# Patient Record
Sex: Female | Born: 1950 | Race: Black or African American | Hispanic: No | Smoking: Never smoker
Health system: Southern US, Community
[De-identification: ages and names within clinical notes are randomized; demographics above are authoritative.]

## PROBLEM LIST (undated history)

## (undated) DIAGNOSIS — E039 Hypothyroidism, unspecified: Secondary | ICD-10-CM

## (undated) DIAGNOSIS — J45909 Unspecified asthma, uncomplicated: Secondary | ICD-10-CM

## (undated) DIAGNOSIS — J309 Allergic rhinitis, unspecified: Secondary | ICD-10-CM

## (undated) DIAGNOSIS — I10 Essential (primary) hypertension: Secondary | ICD-10-CM

## (undated) DIAGNOSIS — E119 Type 2 diabetes mellitus without complications: Secondary | ICD-10-CM

## (undated) HISTORY — DX: Type 2 diabetes mellitus without complications: E11.9

## (undated) HISTORY — PX: KNEE SURGERY: SHX244

## (undated) HISTORY — DX: Hypothyroidism, unspecified: E03.9

## (undated) HISTORY — DX: Essential (primary) hypertension: I10

## (undated) HISTORY — DX: Unspecified asthma, uncomplicated: J45.909

## (undated) HISTORY — DX: Allergic rhinitis, unspecified: J30.9

---

## 2007-12-08 ENCOUNTER — Encounter: Payer: Self-pay | Admitting: Pulmonary Disease

## 2008-07-13 ENCOUNTER — Ambulatory Visit: Payer: Self-pay | Admitting: Pulmonary Disease

## 2008-07-13 DIAGNOSIS — E119 Type 2 diabetes mellitus without complications: Secondary | ICD-10-CM | POA: Insufficient documentation

## 2008-07-13 DIAGNOSIS — J309 Allergic rhinitis, unspecified: Secondary | ICD-10-CM | POA: Insufficient documentation

## 2008-07-13 DIAGNOSIS — I10 Essential (primary) hypertension: Secondary | ICD-10-CM | POA: Insufficient documentation

## 2008-07-13 DIAGNOSIS — G4733 Obstructive sleep apnea (adult) (pediatric): Secondary | ICD-10-CM | POA: Insufficient documentation

## 2008-07-13 DIAGNOSIS — J45909 Unspecified asthma, uncomplicated: Secondary | ICD-10-CM | POA: Insufficient documentation

## 2008-07-17 ENCOUNTER — Telehealth: Payer: Self-pay | Admitting: Pulmonary Disease

## 2008-08-17 ENCOUNTER — Telehealth (INDEPENDENT_AMBULATORY_CARE_PROVIDER_SITE_OTHER): Payer: Self-pay | Admitting: *Deleted

## 2008-12-30 ENCOUNTER — Encounter: Payer: Self-pay | Admitting: Pulmonary Disease

## 2008-12-30 ENCOUNTER — Ambulatory Visit (HOSPITAL_BASED_OUTPATIENT_CLINIC_OR_DEPARTMENT_OTHER): Admission: RE | Admit: 2008-12-30 | Discharge: 2008-12-30 | Payer: Self-pay | Admitting: Pulmonary Disease

## 2009-01-10 ENCOUNTER — Ambulatory Visit: Payer: Self-pay | Admitting: Pulmonary Disease

## 2009-01-10 ENCOUNTER — Telehealth (INDEPENDENT_AMBULATORY_CARE_PROVIDER_SITE_OTHER): Payer: Self-pay | Admitting: *Deleted

## 2010-09-03 ENCOUNTER — Encounter: Payer: Self-pay | Admitting: Pulmonary Disease

## 2010-09-05 ENCOUNTER — Ambulatory Visit: Payer: Self-pay | Admitting: Pulmonary Disease

## 2010-09-10 ENCOUNTER — Encounter: Payer: Self-pay | Admitting: Pulmonary Disease

## 2010-09-19 ENCOUNTER — Ambulatory Visit: Payer: Self-pay | Admitting: Pulmonary Disease

## 2010-09-27 ENCOUNTER — Ambulatory Visit: Payer: Self-pay | Admitting: Pulmonary Disease

## 2010-10-18 ENCOUNTER — Ambulatory Visit: Payer: Self-pay | Admitting: Pulmonary Disease

## 2010-11-01 ENCOUNTER — Ambulatory Visit: Payer: Self-pay | Admitting: Pulmonary Disease

## 2011-11-14 ENCOUNTER — Ambulatory Visit: Payer: PRIVATE HEALTH INSURANCE | Attending: Internal Medicine | Admitting: Rehabilitation

## 2011-11-17 ENCOUNTER — Ambulatory Visit: Payer: Self-pay | Admitting: Physical Therapy

## 2011-12-03 ENCOUNTER — Ambulatory Visit: Payer: PRIVATE HEALTH INSURANCE | Admitting: Physical Therapy

## 2011-12-10 ENCOUNTER — Ambulatory Visit: Payer: PRIVATE HEALTH INSURANCE | Attending: Internal Medicine | Admitting: Physical Therapy

## 2011-12-10 DIAGNOSIS — M25669 Stiffness of unspecified knee, not elsewhere classified: Secondary | ICD-10-CM | POA: Insufficient documentation

## 2011-12-10 DIAGNOSIS — M25569 Pain in unspecified knee: Secondary | ICD-10-CM | POA: Insufficient documentation

## 2011-12-10 DIAGNOSIS — IMO0001 Reserved for inherently not codable concepts without codable children: Secondary | ICD-10-CM | POA: Insufficient documentation

## 2011-12-10 DIAGNOSIS — R262 Difficulty in walking, not elsewhere classified: Secondary | ICD-10-CM | POA: Insufficient documentation

## 2011-12-15 ENCOUNTER — Ambulatory Visit: Payer: PRIVATE HEALTH INSURANCE | Admitting: *Deleted

## 2011-12-25 ENCOUNTER — Ambulatory Visit
Payer: PRIVATE HEALTH INSURANCE | Attending: Internal Medicine | Admitting: Rehabilitative and Restorative Service Providers"

## 2011-12-25 DIAGNOSIS — M25569 Pain in unspecified knee: Secondary | ICD-10-CM | POA: Insufficient documentation

## 2011-12-25 DIAGNOSIS — IMO0001 Reserved for inherently not codable concepts without codable children: Secondary | ICD-10-CM | POA: Insufficient documentation

## 2011-12-25 DIAGNOSIS — R262 Difficulty in walking, not elsewhere classified: Secondary | ICD-10-CM | POA: Insufficient documentation

## 2011-12-25 DIAGNOSIS — M25669 Stiffness of unspecified knee, not elsewhere classified: Secondary | ICD-10-CM | POA: Insufficient documentation

## 2011-12-30 ENCOUNTER — Ambulatory Visit: Payer: PRIVATE HEALTH INSURANCE

## 2011-12-30 ENCOUNTER — Ambulatory Visit: Payer: PRIVATE HEALTH INSURANCE | Admitting: Dietician

## 2012-01-01 ENCOUNTER — Ambulatory Visit: Payer: PRIVATE HEALTH INSURANCE | Admitting: Physical Therapy

## 2012-01-05 ENCOUNTER — Encounter: Payer: PRIVATE HEALTH INSURANCE | Admitting: Physical Therapy

## 2012-01-08 ENCOUNTER — Ambulatory Visit: Payer: PRIVATE HEALTH INSURANCE | Admitting: Rehabilitation

## 2012-01-13 ENCOUNTER — Ambulatory Visit: Payer: PRIVATE HEALTH INSURANCE | Admitting: Physical Therapy

## 2012-01-15 ENCOUNTER — Ambulatory Visit: Payer: PRIVATE HEALTH INSURANCE | Admitting: Rehabilitative and Restorative Service Providers"

## 2012-01-19 ENCOUNTER — Ambulatory Visit: Payer: PRIVATE HEALTH INSURANCE | Admitting: Dietician

## 2012-01-28 ENCOUNTER — Ambulatory Visit: Payer: PRIVATE HEALTH INSURANCE | Admitting: Dietician

## 2012-12-21 ENCOUNTER — Encounter (HOSPITAL_COMMUNITY): Payer: Self-pay | Admitting: Pharmacy Technician

## 2012-12-28 ENCOUNTER — Ambulatory Visit (HOSPITAL_COMMUNITY)
Admission: RE | Admit: 2012-12-28 | Discharge: 2012-12-28 | Disposition: A | Discharge status: Home / Self Care | Payer: BC Managed Care – PPO | Source: Ambulatory Visit | Attending: Cardiology | Admitting: Cardiology

## 2012-12-28 ENCOUNTER — Encounter (HOSPITAL_COMMUNITY)
Admission: RE | Disposition: A | Discharge status: Home / Self Care | Payer: Self-pay | Source: Ambulatory Visit | Attending: Cardiology

## 2012-12-28 DIAGNOSIS — Z79899 Other long term (current) drug therapy: Secondary | ICD-10-CM | POA: Insufficient documentation

## 2012-12-28 DIAGNOSIS — R7309 Other abnormal glucose: Secondary | ICD-10-CM | POA: Insufficient documentation

## 2012-12-28 DIAGNOSIS — E7800 Pure hypercholesterolemia, unspecified: Secondary | ICD-10-CM | POA: Insufficient documentation

## 2012-12-28 DIAGNOSIS — Z87891 Personal history of nicotine dependence: Secondary | ICD-10-CM | POA: Insufficient documentation

## 2012-12-28 DIAGNOSIS — Z8249 Family history of ischemic heart disease and other diseases of the circulatory system: Secondary | ICD-10-CM | POA: Insufficient documentation

## 2012-12-28 DIAGNOSIS — R9439 Abnormal result of other cardiovascular function study: Secondary | ICD-10-CM | POA: Insufficient documentation

## 2012-12-28 DIAGNOSIS — E785 Hyperlipidemia, unspecified: Secondary | ICD-10-CM | POA: Insufficient documentation

## 2012-12-28 DIAGNOSIS — I10 Essential (primary) hypertension: Secondary | ICD-10-CM | POA: Insufficient documentation

## 2012-12-28 HISTORY — PX: LEFT HEART CATHETERIZATION WITH CORONARY ANGIOGRAM: SHX5451

## 2012-12-28 SURGERY — LEFT HEART CATHETERIZATION WITH CORONARY ANGIOGRAM
Anesthesia: LOCAL

## 2012-12-28 MED ORDER — HEPARIN SODIUM (PORCINE) 1000 UNIT/ML IJ SOLN
INTRAMUSCULAR | Status: AC
  Filled 2012-12-28: qty 1

## 2012-12-28 MED ORDER — SODIUM CHLORIDE 0.9 % IV SOLN: 1.0000 mL/kg/h | INTRAVENOUS | Status: DC

## 2012-12-28 MED ORDER — VERAPAMIL HCL 2.5 MG/ML IV SOLN
INTRAVENOUS | Status: AC
  Filled 2012-12-28: qty 2

## 2012-12-28 MED ORDER — NITROGLYCERIN 0.2 MG/ML ON CALL CATH LAB
INTRAVENOUS | Status: AC
  Filled 2012-12-28: qty 1

## 2012-12-28 MED ORDER — ASPIRIN 81 MG PO CHEW
CHEWABLE_TABLET | ORAL | Status: AC
  Filled 2012-12-28: qty 4

## 2012-12-28 MED ORDER — SODIUM CHLORIDE 0.9 % IV BOLUS (SEPSIS)
500.0000 mL | Freq: Once | INTRAVENOUS | Status: AC
  Administered 2012-12-28: 08:00:00 via INTRAVENOUS

## 2012-12-28 MED ORDER — SODIUM CHLORIDE 0.9 % IJ SOLN: 3.0000 mL | Freq: Two times a day (BID) | INTRAMUSCULAR | Status: DC

## 2012-12-28 MED ORDER — SODIUM CHLORIDE 0.9 % IV SOLN
INTRAVENOUS | Status: DC
  Administered 2012-12-28: 08:00:00 via INTRAVENOUS

## 2012-12-28 MED ORDER — ONDANSETRON HCL 4 MG/2ML IJ SOLN: 4.0000 mg | Freq: Four times a day (QID) | INTRAMUSCULAR | Status: DC | PRN

## 2012-12-28 MED ORDER — ASPIRIN 81 MG PO CHEW
324.0000 mg | CHEWABLE_TABLET | ORAL | Status: AC
  Administered 2012-12-28: 324 mg via ORAL

## 2012-12-28 MED ORDER — SODIUM CHLORIDE 0.9 % IJ SOLN: 3.0000 mL | INTRAMUSCULAR | Status: DC | PRN

## 2012-12-28 MED ORDER — LIDOCAINE HCL (PF) 1 % IJ SOLN
INTRAMUSCULAR | Status: AC
  Filled 2012-12-28: qty 30

## 2012-12-28 MED ORDER — FENTANYL CITRATE 0.05 MG/ML IJ SOLN
INTRAMUSCULAR | Status: AC
  Filled 2012-12-28: qty 2

## 2012-12-28 MED ORDER — LABETALOL HCL 5 MG/ML IV SOLN
INTRAVENOUS | Status: AC
  Filled 2012-12-28: qty 4

## 2012-12-28 MED ORDER — SODIUM CHLORIDE 0.9 % IV SOLN: 250.0000 mL | INTRAVENOUS | Status: DC | PRN

## 2012-12-28 MED ORDER — MIDAZOLAM HCL 5 MG/5ML IJ SOLN
INTRAMUSCULAR | Status: AC
  Filled 2012-12-28: qty 5

## 2012-12-28 MED ORDER — HEPARIN (PORCINE) IN NACL 2-0.9 UNIT/ML-% IJ SOLN
INTRAMUSCULAR | Status: AC
  Filled 2012-12-28: qty 1000

## 2012-12-28 MED ORDER — ACETAMINOPHEN 325 MG PO TABS: 650.0000 mg | ORAL_TABLET | ORAL | Status: DC | PRN

## 2012-12-28 NOTE — Interval H&P Note (Signed)
History and Physical Interval Note:  12/28/2012 8:37 AM  Nicole Morrison  has presented today for surgery, with the diagnosis of Chest pain  The various methods of treatment have been discussed with the patient and family. After consideration of risks, benefits and other options for treatment, the patient has consented to  Procedure(s): LEFT HEART CATHETERIZATION WITH CORONARY ANGIOGRAM (N/A) and possible PTCA as a surgical intervention .  The patient's history has been reviewed, patient examined, no change in status, stable for surgery.  I have reviewed the patient's chart and labs.  Questions were answered to the patient's satisfaction.   Cath Lab Visit (complete for each Cath Lab visit)  Clinical Evaluation Leading to the Procedure:   ACS: no  Non-ACS:    Anginal Classification: CCS III  Anti-ischemic medical therapy: Maximal Therapy (2 or more classes of medications)  Non-Invasive Test Results: High-risk stress test findings: cardiac mortality >3%/year  Prior CABG: No previous CABG        [facility]

## 2012-12-28 NOTE — CV Procedure (Signed)
Procedure performed:  Left heart catheterization including hemodynamic monitoring of the left ventricle, LV gram, selective right and left coronary arteriography.  Indication patient is a 62 year-old AA female with history of hypertension,  hyperlipidemia, Borderline Diabetes Mellitus   who presents with shortness of breath and needs knee replacement. Patient has  had non invasive testing which was abnormal, revealing severe moderate to large sized inferior and inferolateral ischemia. Given her multiple cardiovascular risk factors, felt to be high risk stress test.  Hence is brought to the cardiac catheterization lab to evaluate the  coronary anatomy for definitive diagnosis of CAD.  Hemodynamic data:  Left ventricular pressure was 161/28 with LVEDP of 39 mm mercury. Aortic pressure was 174/110 with a mean of 138 mm mercury. There was no pressure gradient across the aortic valve  Left ventricle: Performed in the RAO projection revealed LVEF of 60%. There was No significant MR. No wall motion abnormality.  Right coronary artery: The vessel is smooth, large, normal,  Dominant.  Left main coronary artery is large and normal.  Circumflex coronary artery: A large vessel giving origin to a large obtuse marginal 1. It is smooth and normal.   LAD:  LAD gives origin to a large diagonal-1.  LAD has mid 20-30% stenosis, otherwise smooth, wraps around the apex.  Impression: No significant coronary artery disease by coronary angiography, mild proximal coronary calcification is evident in the left coronary artery. 20-30% stenosis in the mid LAD which does not appear to be any significant disease and they're very large caliber vessel.  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  Recommendation: Patient can be taken up for the upcoming knee surgery with acceptable cardiovascular risk, although respiratory  compromise due to morbid obesity and development of bony edema due to hypertensive heart disease needs to be kept in mind. Would recommend aggressive control of hypertension and diuretics perioperatively. I be happy to provide assistance if needed.  Technique: Under sterile precautions using a 6 French right radial  arterial access, a 6 French sheath was introduced into the right radial artery. A 5 [place] Tig 4 catheter was advanced into the ascending aorta selective  r left coronary artery was cannulated and angiography was performed in multiple views. The catheter was pulled back Out of the body over exchange length J-wire.  Same Catheter was used to perform LV gram which was performed in RAO projection. The catheter was exchanged over a J-wire and a 5 [place] JR 4 diagnostic cath was utilized in the right coronary artery and angiography was performed.  Catheter exchanged out of the body over J-Wire. NO immediate complications noted. Patient tolerated the procedure well. A total of 75 cc of contrast was utilized for diagnostic angiography  Rec: Medical therapy with aggressive risk factor reduction.   Disposition: Will be discharged home today with outpatient follow up.

## 2012-12-28 NOTE — H&P (Signed)
  Please see office visit notes for complete details of HPI.  

## 2014-03-30 ENCOUNTER — Encounter (HOSPITAL_COMMUNITY): Payer: Self-pay | Admitting: Cardiology

## 2015-05-10 DIAGNOSIS — M1990 Unspecified osteoarthritis, unspecified site: Secondary | ICD-10-CM | POA: Diagnosis not present

## 2015-05-10 DIAGNOSIS — K30 Functional dyspepsia: Secondary | ICD-10-CM | POA: Diagnosis not present

## 2015-05-10 DIAGNOSIS — I10 Essential (primary) hypertension: Secondary | ICD-10-CM | POA: Diagnosis not present

## 2015-05-10 DIAGNOSIS — E559 Vitamin D deficiency, unspecified: Secondary | ICD-10-CM | POA: Diagnosis not present

## 2015-05-10 DIAGNOSIS — R635 Abnormal weight gain: Secondary | ICD-10-CM | POA: Diagnosis not present

## 2015-05-10 DIAGNOSIS — J45998 Other asthma: Secondary | ICD-10-CM | POA: Diagnosis not present

## 2015-05-10 DIAGNOSIS — E039 Hypothyroidism, unspecified: Secondary | ICD-10-CM | POA: Diagnosis not present

## 2015-05-10 DIAGNOSIS — I509 Heart failure, unspecified: Secondary | ICD-10-CM | POA: Diagnosis not present

## 2015-05-10 DIAGNOSIS — E119 Type 2 diabetes mellitus without complications: Secondary | ICD-10-CM | POA: Diagnosis not present

## 2015-06-19 DIAGNOSIS — I10 Essential (primary) hypertension: Secondary | ICD-10-CM | POA: Diagnosis not present

## 2015-06-19 DIAGNOSIS — E559 Vitamin D deficiency, unspecified: Secondary | ICD-10-CM | POA: Diagnosis not present

## 2015-07-19 DIAGNOSIS — I10 Essential (primary) hypertension: Secondary | ICD-10-CM | POA: Diagnosis not present

## 2015-07-19 DIAGNOSIS — E559 Vitamin D deficiency, unspecified: Secondary | ICD-10-CM | POA: Diagnosis not present

## 2015-07-26 DIAGNOSIS — I10 Essential (primary) hypertension: Secondary | ICD-10-CM | POA: Diagnosis not present

## 2015-07-26 DIAGNOSIS — E559 Vitamin D deficiency, unspecified: Secondary | ICD-10-CM | POA: Diagnosis not present

## 2015-07-31 DIAGNOSIS — E559 Vitamin D deficiency, unspecified: Secondary | ICD-10-CM | POA: Diagnosis not present

## 2015-07-31 DIAGNOSIS — I509 Heart failure, unspecified: Secondary | ICD-10-CM | POA: Diagnosis not present

## 2015-07-31 DIAGNOSIS — J45998 Other asthma: Secondary | ICD-10-CM | POA: Diagnosis not present

## 2015-07-31 DIAGNOSIS — K30 Functional dyspepsia: Secondary | ICD-10-CM | POA: Diagnosis not present

## 2015-07-31 DIAGNOSIS — E039 Hypothyroidism, unspecified: Secondary | ICD-10-CM | POA: Diagnosis not present

## 2015-07-31 DIAGNOSIS — M1710 Unilateral primary osteoarthritis, unspecified knee: Secondary | ICD-10-CM | POA: Diagnosis not present

## 2015-07-31 DIAGNOSIS — I10 Essential (primary) hypertension: Secondary | ICD-10-CM | POA: Diagnosis not present

## 2015-07-31 DIAGNOSIS — R739 Hyperglycemia, unspecified: Secondary | ICD-10-CM | POA: Diagnosis not present

## 2015-07-31 DIAGNOSIS — M1990 Unspecified osteoarthritis, unspecified site: Secondary | ICD-10-CM | POA: Diagnosis not present

## 2015-07-31 DIAGNOSIS — E785 Hyperlipidemia, unspecified: Secondary | ICD-10-CM | POA: Diagnosis not present

## 2015-08-23 DIAGNOSIS — E559 Vitamin D deficiency, unspecified: Secondary | ICD-10-CM | POA: Diagnosis not present

## 2015-08-23 DIAGNOSIS — I10 Essential (primary) hypertension: Secondary | ICD-10-CM | POA: Diagnosis not present

## 2015-09-20 DIAGNOSIS — E559 Vitamin D deficiency, unspecified: Secondary | ICD-10-CM | POA: Diagnosis not present

## 2015-09-20 DIAGNOSIS — I10 Essential (primary) hypertension: Secondary | ICD-10-CM | POA: Diagnosis not present

## 2015-10-18 DIAGNOSIS — J45998 Other asthma: Secondary | ICD-10-CM | POA: Diagnosis not present

## 2015-10-18 DIAGNOSIS — I509 Heart failure, unspecified: Secondary | ICD-10-CM | POA: Diagnosis not present

## 2015-10-18 DIAGNOSIS — K30 Functional dyspepsia: Secondary | ICD-10-CM | POA: Diagnosis not present

## 2015-10-18 DIAGNOSIS — M1990 Unspecified osteoarthritis, unspecified site: Secondary | ICD-10-CM | POA: Diagnosis not present

## 2015-10-18 DIAGNOSIS — E559 Vitamin D deficiency, unspecified: Secondary | ICD-10-CM | POA: Diagnosis not present

## 2015-10-18 DIAGNOSIS — M1710 Unilateral primary osteoarthritis, unspecified knee: Secondary | ICD-10-CM | POA: Diagnosis not present

## 2015-10-18 DIAGNOSIS — I10 Essential (primary) hypertension: Secondary | ICD-10-CM | POA: Diagnosis not present

## 2015-10-18 DIAGNOSIS — R739 Hyperglycemia, unspecified: Secondary | ICD-10-CM | POA: Diagnosis not present

## 2015-10-18 DIAGNOSIS — E039 Hypothyroidism, unspecified: Secondary | ICD-10-CM | POA: Diagnosis not present

## 2015-10-24 DIAGNOSIS — E559 Vitamin D deficiency, unspecified: Secondary | ICD-10-CM | POA: Diagnosis not present

## 2015-10-24 DIAGNOSIS — I10 Essential (primary) hypertension: Secondary | ICD-10-CM | POA: Diagnosis not present

## 2015-11-21 DIAGNOSIS — I10 Essential (primary) hypertension: Secondary | ICD-10-CM | POA: Diagnosis not present

## 2015-11-21 DIAGNOSIS — E559 Vitamin D deficiency, unspecified: Secondary | ICD-10-CM | POA: Diagnosis not present

## 2016-01-18 DIAGNOSIS — E559 Vitamin D deficiency, unspecified: Secondary | ICD-10-CM | POA: Diagnosis not present

## 2016-01-18 DIAGNOSIS — I10 Essential (primary) hypertension: Secondary | ICD-10-CM | POA: Diagnosis not present

## 2016-01-25 DIAGNOSIS — E039 Hypothyroidism, unspecified: Secondary | ICD-10-CM | POA: Insufficient documentation

## 2016-01-25 DIAGNOSIS — I5022 Chronic systolic (congestive) heart failure: Secondary | ICD-10-CM | POA: Insufficient documentation

## 2016-02-15 DIAGNOSIS — E559 Vitamin D deficiency, unspecified: Secondary | ICD-10-CM | POA: Diagnosis not present

## 2016-02-15 DIAGNOSIS — I10 Essential (primary) hypertension: Secondary | ICD-10-CM | POA: Diagnosis not present

## 2016-03-14 DIAGNOSIS — I10 Essential (primary) hypertension: Secondary | ICD-10-CM | POA: Diagnosis not present

## 2016-03-14 DIAGNOSIS — E559 Vitamin D deficiency, unspecified: Secondary | ICD-10-CM | POA: Diagnosis not present

## 2016-03-27 DIAGNOSIS — M1710 Unilateral primary osteoarthritis, unspecified knee: Secondary | ICD-10-CM | POA: Diagnosis not present

## 2016-03-27 DIAGNOSIS — J45998 Other asthma: Secondary | ICD-10-CM | POA: Diagnosis not present

## 2016-03-27 DIAGNOSIS — K30 Functional dyspepsia: Secondary | ICD-10-CM | POA: Diagnosis not present

## 2016-03-27 DIAGNOSIS — I509 Heart failure, unspecified: Secondary | ICD-10-CM | POA: Diagnosis not present

## 2016-03-27 DIAGNOSIS — E559 Vitamin D deficiency, unspecified: Secondary | ICD-10-CM | POA: Diagnosis not present

## 2016-03-27 DIAGNOSIS — R739 Hyperglycemia, unspecified: Secondary | ICD-10-CM | POA: Diagnosis not present

## 2016-03-27 DIAGNOSIS — M1990 Unspecified osteoarthritis, unspecified site: Secondary | ICD-10-CM | POA: Diagnosis not present

## 2016-03-27 DIAGNOSIS — E039 Hypothyroidism, unspecified: Secondary | ICD-10-CM | POA: Diagnosis not present

## 2016-03-27 DIAGNOSIS — I10 Essential (primary) hypertension: Secondary | ICD-10-CM | POA: Diagnosis not present

## 2016-04-17 DIAGNOSIS — E559 Vitamin D deficiency, unspecified: Secondary | ICD-10-CM | POA: Diagnosis not present

## 2016-04-17 DIAGNOSIS — I10 Essential (primary) hypertension: Secondary | ICD-10-CM | POA: Diagnosis not present

## 2016-05-22 DIAGNOSIS — I10 Essential (primary) hypertension: Secondary | ICD-10-CM | POA: Diagnosis not present

## 2016-05-22 DIAGNOSIS — E039 Hypothyroidism, unspecified: Secondary | ICD-10-CM | POA: Diagnosis not present

## 2016-05-22 DIAGNOSIS — M1710 Unilateral primary osteoarthritis, unspecified knee: Secondary | ICD-10-CM | POA: Diagnosis not present

## 2016-05-22 DIAGNOSIS — M1990 Unspecified osteoarthritis, unspecified site: Secondary | ICD-10-CM | POA: Diagnosis not present

## 2016-05-22 DIAGNOSIS — K30 Functional dyspepsia: Secondary | ICD-10-CM | POA: Diagnosis not present

## 2016-05-22 DIAGNOSIS — I509 Heart failure, unspecified: Secondary | ICD-10-CM | POA: Diagnosis not present

## 2016-05-22 DIAGNOSIS — R739 Hyperglycemia, unspecified: Secondary | ICD-10-CM | POA: Diagnosis not present

## 2016-05-22 DIAGNOSIS — N3941 Urge incontinence: Secondary | ICD-10-CM | POA: Diagnosis not present

## 2016-05-22 DIAGNOSIS — J45998 Other asthma: Secondary | ICD-10-CM | POA: Diagnosis not present

## 2016-06-03 DIAGNOSIS — J45998 Other asthma: Secondary | ICD-10-CM | POA: Diagnosis not present

## 2016-06-03 DIAGNOSIS — I10 Essential (primary) hypertension: Secondary | ICD-10-CM | POA: Diagnosis not present

## 2016-06-03 DIAGNOSIS — R739 Hyperglycemia, unspecified: Secondary | ICD-10-CM | POA: Diagnosis not present

## 2016-06-03 DIAGNOSIS — E039 Hypothyroidism, unspecified: Secondary | ICD-10-CM | POA: Diagnosis not present

## 2016-06-03 DIAGNOSIS — M1990 Unspecified osteoarthritis, unspecified site: Secondary | ICD-10-CM | POA: Diagnosis not present

## 2016-06-03 DIAGNOSIS — E559 Vitamin D deficiency, unspecified: Secondary | ICD-10-CM | POA: Diagnosis not present

## 2016-06-03 DIAGNOSIS — I509 Heart failure, unspecified: Secondary | ICD-10-CM | POA: Diagnosis not present

## 2016-06-03 DIAGNOSIS — M79641 Pain in right hand: Secondary | ICD-10-CM | POA: Diagnosis not present

## 2016-06-03 DIAGNOSIS — K30 Functional dyspepsia: Secondary | ICD-10-CM | POA: Diagnosis not present

## 2016-06-13 DIAGNOSIS — Z6841 Body Mass Index (BMI) 40.0 and over, adult: Secondary | ICD-10-CM | POA: Diagnosis not present

## 2016-06-13 DIAGNOSIS — M79641 Pain in right hand: Secondary | ICD-10-CM | POA: Insufficient documentation

## 2016-06-13 DIAGNOSIS — M1712 Unilateral primary osteoarthritis, left knee: Secondary | ICD-10-CM | POA: Insufficient documentation

## 2016-06-13 DIAGNOSIS — M170 Bilateral primary osteoarthritis of knee: Secondary | ICD-10-CM | POA: Diagnosis not present

## 2016-06-17 DIAGNOSIS — J45998 Other asthma: Secondary | ICD-10-CM | POA: Diagnosis not present

## 2016-06-17 DIAGNOSIS — M1710 Unilateral primary osteoarthritis, unspecified knee: Secondary | ICD-10-CM | POA: Diagnosis not present

## 2016-06-17 DIAGNOSIS — M1990 Unspecified osteoarthritis, unspecified site: Secondary | ICD-10-CM | POA: Diagnosis not present

## 2016-06-17 DIAGNOSIS — E039 Hypothyroidism, unspecified: Secondary | ICD-10-CM | POA: Diagnosis not present

## 2016-06-17 DIAGNOSIS — R739 Hyperglycemia, unspecified: Secondary | ICD-10-CM | POA: Diagnosis not present

## 2016-06-17 DIAGNOSIS — K30 Functional dyspepsia: Secondary | ICD-10-CM | POA: Diagnosis not present

## 2016-06-17 DIAGNOSIS — N3941 Urge incontinence: Secondary | ICD-10-CM | POA: Diagnosis not present

## 2016-06-17 DIAGNOSIS — I509 Heart failure, unspecified: Secondary | ICD-10-CM | POA: Diagnosis not present

## 2016-06-17 DIAGNOSIS — I10 Essential (primary) hypertension: Secondary | ICD-10-CM | POA: Diagnosis not present

## 2016-07-22 DIAGNOSIS — E785 Hyperlipidemia, unspecified: Secondary | ICD-10-CM | POA: Diagnosis not present

## 2016-07-22 DIAGNOSIS — E039 Hypothyroidism, unspecified: Secondary | ICD-10-CM | POA: Diagnosis not present

## 2016-07-22 DIAGNOSIS — I509 Heart failure, unspecified: Secondary | ICD-10-CM | POA: Diagnosis not present

## 2016-07-22 DIAGNOSIS — E559 Vitamin D deficiency, unspecified: Secondary | ICD-10-CM | POA: Diagnosis not present

## 2016-07-22 DIAGNOSIS — J45998 Other asthma: Secondary | ICD-10-CM | POA: Diagnosis not present

## 2016-07-22 DIAGNOSIS — M1710 Unilateral primary osteoarthritis, unspecified knee: Secondary | ICD-10-CM | POA: Diagnosis not present

## 2016-07-22 DIAGNOSIS — M1990 Unspecified osteoarthritis, unspecified site: Secondary | ICD-10-CM | POA: Diagnosis not present

## 2016-07-22 DIAGNOSIS — I10 Essential (primary) hypertension: Secondary | ICD-10-CM | POA: Diagnosis not present

## 2016-07-22 DIAGNOSIS — K30 Functional dyspepsia: Secondary | ICD-10-CM | POA: Diagnosis not present

## 2016-08-28 DIAGNOSIS — E039 Hypothyroidism, unspecified: Secondary | ICD-10-CM | POA: Diagnosis not present

## 2016-09-11 DIAGNOSIS — M1712 Unilateral primary osteoarthritis, left knee: Secondary | ICD-10-CM | POA: Diagnosis not present

## 2016-09-11 DIAGNOSIS — M79641 Pain in right hand: Secondary | ICD-10-CM | POA: Diagnosis not present

## 2016-09-16 DIAGNOSIS — I10 Essential (primary) hypertension: Secondary | ICD-10-CM | POA: Diagnosis not present

## 2016-09-16 DIAGNOSIS — E039 Hypothyroidism, unspecified: Secondary | ICD-10-CM | POA: Diagnosis not present

## 2016-09-16 DIAGNOSIS — E785 Hyperlipidemia, unspecified: Secondary | ICD-10-CM | POA: Diagnosis not present

## 2016-09-16 DIAGNOSIS — I509 Heart failure, unspecified: Secondary | ICD-10-CM | POA: Diagnosis not present

## 2016-09-16 DIAGNOSIS — M1990 Unspecified osteoarthritis, unspecified site: Secondary | ICD-10-CM | POA: Diagnosis not present

## 2016-09-16 DIAGNOSIS — E559 Vitamin D deficiency, unspecified: Secondary | ICD-10-CM | POA: Diagnosis not present

## 2016-09-16 DIAGNOSIS — M1710 Unilateral primary osteoarthritis, unspecified knee: Secondary | ICD-10-CM | POA: Diagnosis not present

## 2016-09-16 DIAGNOSIS — K30 Functional dyspepsia: Secondary | ICD-10-CM | POA: Diagnosis not present

## 2016-09-16 DIAGNOSIS — J45998 Other asthma: Secondary | ICD-10-CM | POA: Diagnosis not present

## 2016-11-13 DIAGNOSIS — Z1231 Encounter for screening mammogram for malignant neoplasm of breast: Secondary | ICD-10-CM | POA: Diagnosis not present

## 2016-11-14 DIAGNOSIS — E118 Type 2 diabetes mellitus with unspecified complications: Secondary | ICD-10-CM | POA: Diagnosis not present

## 2016-12-15 DIAGNOSIS — E039 Hypothyroidism, unspecified: Secondary | ICD-10-CM | POA: Diagnosis not present

## 2016-12-15 DIAGNOSIS — E559 Vitamin D deficiency, unspecified: Secondary | ICD-10-CM | POA: Diagnosis not present

## 2016-12-15 DIAGNOSIS — M1710 Unilateral primary osteoarthritis, unspecified knee: Secondary | ICD-10-CM | POA: Diagnosis not present

## 2016-12-15 DIAGNOSIS — I10 Essential (primary) hypertension: Secondary | ICD-10-CM | POA: Diagnosis not present

## 2016-12-15 DIAGNOSIS — J45998 Other asthma: Secondary | ICD-10-CM | POA: Diagnosis not present

## 2016-12-15 DIAGNOSIS — I509 Heart failure, unspecified: Secondary | ICD-10-CM | POA: Diagnosis not present

## 2016-12-15 DIAGNOSIS — M1990 Unspecified osteoarthritis, unspecified site: Secondary | ICD-10-CM | POA: Diagnosis not present

## 2016-12-15 DIAGNOSIS — E1165 Type 2 diabetes mellitus with hyperglycemia: Secondary | ICD-10-CM | POA: Diagnosis not present

## 2016-12-15 DIAGNOSIS — Z0000 Encounter for general adult medical examination without abnormal findings: Secondary | ICD-10-CM | POA: Diagnosis not present

## 2016-12-15 DIAGNOSIS — Z136 Encounter for screening for cardiovascular disorders: Secondary | ICD-10-CM | POA: Diagnosis not present

## 2016-12-15 DIAGNOSIS — Z01118 Encounter for examination of ears and hearing with other abnormal findings: Secondary | ICD-10-CM | POA: Diagnosis not present

## 2016-12-15 DIAGNOSIS — E785 Hyperlipidemia, unspecified: Secondary | ICD-10-CM | POA: Diagnosis not present

## 2017-01-09 DIAGNOSIS — E1165 Type 2 diabetes mellitus with hyperglycemia: Secondary | ICD-10-CM | POA: Diagnosis not present

## 2017-01-09 DIAGNOSIS — J45998 Other asthma: Secondary | ICD-10-CM | POA: Diagnosis not present

## 2017-01-09 DIAGNOSIS — I509 Heart failure, unspecified: Secondary | ICD-10-CM | POA: Diagnosis not present

## 2017-01-09 DIAGNOSIS — E039 Hypothyroidism, unspecified: Secondary | ICD-10-CM | POA: Diagnosis not present

## 2017-01-09 DIAGNOSIS — E559 Vitamin D deficiency, unspecified: Secondary | ICD-10-CM | POA: Diagnosis not present

## 2017-01-09 DIAGNOSIS — Z0000 Encounter for general adult medical examination without abnormal findings: Secondary | ICD-10-CM | POA: Diagnosis not present

## 2017-01-09 DIAGNOSIS — I10 Essential (primary) hypertension: Secondary | ICD-10-CM | POA: Diagnosis not present

## 2017-01-09 DIAGNOSIS — E785 Hyperlipidemia, unspecified: Secondary | ICD-10-CM | POA: Diagnosis not present

## 2017-03-09 DIAGNOSIS — E559 Vitamin D deficiency, unspecified: Secondary | ICD-10-CM | POA: Diagnosis not present

## 2017-03-09 DIAGNOSIS — I10 Essential (primary) hypertension: Secondary | ICD-10-CM | POA: Diagnosis not present

## 2017-03-09 DIAGNOSIS — E785 Hyperlipidemia, unspecified: Secondary | ICD-10-CM | POA: Diagnosis not present

## 2017-03-09 DIAGNOSIS — M1990 Unspecified osteoarthritis, unspecified site: Secondary | ICD-10-CM | POA: Diagnosis not present

## 2017-03-09 DIAGNOSIS — J45998 Other asthma: Secondary | ICD-10-CM | POA: Diagnosis not present

## 2017-03-09 DIAGNOSIS — E039 Hypothyroidism, unspecified: Secondary | ICD-10-CM | POA: Diagnosis not present

## 2017-03-09 DIAGNOSIS — I509 Heart failure, unspecified: Secondary | ICD-10-CM | POA: Diagnosis not present

## 2017-03-09 DIAGNOSIS — E1165 Type 2 diabetes mellitus with hyperglycemia: Secondary | ICD-10-CM | POA: Diagnosis not present

## 2017-03-11 DIAGNOSIS — I10 Essential (primary) hypertension: Secondary | ICD-10-CM | POA: Diagnosis not present

## 2017-03-11 DIAGNOSIS — E1165 Type 2 diabetes mellitus with hyperglycemia: Secondary | ICD-10-CM | POA: Diagnosis not present

## 2017-04-03 DIAGNOSIS — E039 Hypothyroidism, unspecified: Secondary | ICD-10-CM | POA: Diagnosis not present

## 2017-04-03 DIAGNOSIS — I509 Heart failure, unspecified: Secondary | ICD-10-CM | POA: Diagnosis not present

## 2017-04-03 DIAGNOSIS — I10 Essential (primary) hypertension: Secondary | ICD-10-CM | POA: Diagnosis not present

## 2017-04-03 DIAGNOSIS — M1990 Unspecified osteoarthritis, unspecified site: Secondary | ICD-10-CM | POA: Diagnosis not present

## 2017-04-03 DIAGNOSIS — E785 Hyperlipidemia, unspecified: Secondary | ICD-10-CM | POA: Diagnosis not present

## 2017-04-03 DIAGNOSIS — E559 Vitamin D deficiency, unspecified: Secondary | ICD-10-CM | POA: Diagnosis not present

## 2017-04-03 DIAGNOSIS — E1165 Type 2 diabetes mellitus with hyperglycemia: Secondary | ICD-10-CM | POA: Diagnosis not present

## 2017-04-03 DIAGNOSIS — J45998 Other asthma: Secondary | ICD-10-CM | POA: Diagnosis not present

## 2017-04-15 DIAGNOSIS — E039 Hypothyroidism, unspecified: Secondary | ICD-10-CM | POA: Diagnosis not present

## 2017-04-15 DIAGNOSIS — M1710 Unilateral primary osteoarthritis, unspecified knee: Secondary | ICD-10-CM | POA: Diagnosis not present

## 2017-04-29 DIAGNOSIS — M1710 Unilateral primary osteoarthritis, unspecified knee: Secondary | ICD-10-CM | POA: Diagnosis not present

## 2017-04-29 DIAGNOSIS — I10 Essential (primary) hypertension: Secondary | ICD-10-CM | POA: Diagnosis not present

## 2017-05-27 DIAGNOSIS — H25813 Combined forms of age-related cataract, bilateral: Secondary | ICD-10-CM | POA: Diagnosis not present

## 2017-05-27 DIAGNOSIS — E119 Type 2 diabetes mellitus without complications: Secondary | ICD-10-CM | POA: Diagnosis not present

## 2017-06-03 DIAGNOSIS — M1710 Unilateral primary osteoarthritis, unspecified knee: Secondary | ICD-10-CM | POA: Diagnosis not present

## 2017-06-03 DIAGNOSIS — I10 Essential (primary) hypertension: Secondary | ICD-10-CM | POA: Diagnosis not present

## 2017-06-16 DIAGNOSIS — E1165 Type 2 diabetes mellitus with hyperglycemia: Secondary | ICD-10-CM | POA: Diagnosis not present

## 2017-06-16 DIAGNOSIS — J45998 Other asthma: Secondary | ICD-10-CM | POA: Diagnosis not present

## 2017-06-16 DIAGNOSIS — M1990 Unspecified osteoarthritis, unspecified site: Secondary | ICD-10-CM | POA: Diagnosis not present

## 2017-06-16 DIAGNOSIS — E785 Hyperlipidemia, unspecified: Secondary | ICD-10-CM | POA: Diagnosis not present

## 2017-06-16 DIAGNOSIS — E039 Hypothyroidism, unspecified: Secondary | ICD-10-CM | POA: Diagnosis not present

## 2017-06-16 DIAGNOSIS — E559 Vitamin D deficiency, unspecified: Secondary | ICD-10-CM | POA: Diagnosis not present

## 2017-06-16 DIAGNOSIS — I10 Essential (primary) hypertension: Secondary | ICD-10-CM | POA: Diagnosis not present

## 2017-06-16 DIAGNOSIS — I509 Heart failure, unspecified: Secondary | ICD-10-CM | POA: Diagnosis not present

## 2017-06-24 DIAGNOSIS — M545 Low back pain: Secondary | ICD-10-CM | POA: Diagnosis not present

## 2017-06-24 DIAGNOSIS — M170 Bilateral primary osteoarthritis of knee: Secondary | ICD-10-CM | POA: Diagnosis not present

## 2017-06-24 DIAGNOSIS — M51369 Other intervertebral disc degeneration, lumbar region without mention of lumbar back pain or lower extremity pain: Secondary | ICD-10-CM | POA: Insufficient documentation

## 2017-06-24 DIAGNOSIS — M25561 Pain in right knee: Secondary | ICD-10-CM | POA: Diagnosis not present

## 2017-06-24 DIAGNOSIS — Z6841 Body Mass Index (BMI) 40.0 and over, adult: Secondary | ICD-10-CM | POA: Diagnosis not present

## 2017-06-24 DIAGNOSIS — M5459 Other low back pain: Secondary | ICD-10-CM | POA: Insufficient documentation

## 2017-06-24 DIAGNOSIS — M11261 Other chondrocalcinosis, right knee: Secondary | ICD-10-CM | POA: Diagnosis not present

## 2017-06-24 DIAGNOSIS — M5136 Other intervertebral disc degeneration, lumbar region: Secondary | ICD-10-CM | POA: Diagnosis not present

## 2017-06-24 DIAGNOSIS — G894 Chronic pain syndrome: Secondary | ICD-10-CM | POA: Insufficient documentation

## 2017-06-24 DIAGNOSIS — M47816 Spondylosis without myelopathy or radiculopathy, lumbar region: Secondary | ICD-10-CM | POA: Insufficient documentation

## 2017-06-24 DIAGNOSIS — M47896 Other spondylosis, lumbar region: Secondary | ICD-10-CM | POA: Diagnosis not present

## 2017-06-24 DIAGNOSIS — E785 Hyperlipidemia, unspecified: Secondary | ICD-10-CM | POA: Diagnosis not present

## 2017-06-24 DIAGNOSIS — Z79891 Long term (current) use of opiate analgesic: Secondary | ICD-10-CM | POA: Diagnosis not present

## 2017-06-24 DIAGNOSIS — M25562 Pain in left knee: Secondary | ICD-10-CM | POA: Diagnosis not present

## 2017-07-02 DIAGNOSIS — M25562 Pain in left knee: Secondary | ICD-10-CM | POA: Diagnosis not present

## 2017-07-02 DIAGNOSIS — Z9181 History of falling: Secondary | ICD-10-CM | POA: Diagnosis not present

## 2017-07-02 DIAGNOSIS — I10 Essential (primary) hypertension: Secondary | ICD-10-CM | POA: Diagnosis not present

## 2017-07-02 DIAGNOSIS — M25561 Pain in right knee: Secondary | ICD-10-CM | POA: Diagnosis not present

## 2017-07-02 DIAGNOSIS — Z7982 Long term (current) use of aspirin: Secondary | ICD-10-CM | POA: Diagnosis not present

## 2017-07-06 DIAGNOSIS — Z7982 Long term (current) use of aspirin: Secondary | ICD-10-CM | POA: Diagnosis not present

## 2017-07-06 DIAGNOSIS — M25562 Pain in left knee: Secondary | ICD-10-CM | POA: Diagnosis not present

## 2017-07-06 DIAGNOSIS — I10 Essential (primary) hypertension: Secondary | ICD-10-CM | POA: Diagnosis not present

## 2017-07-06 DIAGNOSIS — Z9181 History of falling: Secondary | ICD-10-CM | POA: Diagnosis not present

## 2017-07-06 DIAGNOSIS — M25561 Pain in right knee: Secondary | ICD-10-CM | POA: Diagnosis not present

## 2017-07-08 DIAGNOSIS — Z7982 Long term (current) use of aspirin: Secondary | ICD-10-CM | POA: Diagnosis not present

## 2017-07-08 DIAGNOSIS — I10 Essential (primary) hypertension: Secondary | ICD-10-CM | POA: Diagnosis not present

## 2017-07-08 DIAGNOSIS — M25562 Pain in left knee: Secondary | ICD-10-CM | POA: Diagnosis not present

## 2017-07-08 DIAGNOSIS — M25561 Pain in right knee: Secondary | ICD-10-CM | POA: Diagnosis not present

## 2017-07-08 DIAGNOSIS — Z9181 History of falling: Secondary | ICD-10-CM | POA: Diagnosis not present

## 2017-07-09 DIAGNOSIS — M25562 Pain in left knee: Secondary | ICD-10-CM | POA: Diagnosis not present

## 2017-07-09 DIAGNOSIS — Z9181 History of falling: Secondary | ICD-10-CM | POA: Diagnosis not present

## 2017-07-09 DIAGNOSIS — I10 Essential (primary) hypertension: Secondary | ICD-10-CM | POA: Diagnosis not present

## 2017-07-09 DIAGNOSIS — M25561 Pain in right knee: Secondary | ICD-10-CM | POA: Diagnosis not present

## 2017-07-09 DIAGNOSIS — Z7982 Long term (current) use of aspirin: Secondary | ICD-10-CM | POA: Diagnosis not present

## 2017-07-13 DIAGNOSIS — I10 Essential (primary) hypertension: Secondary | ICD-10-CM | POA: Diagnosis not present

## 2017-07-13 DIAGNOSIS — Z7982 Long term (current) use of aspirin: Secondary | ICD-10-CM | POA: Diagnosis not present

## 2017-07-13 DIAGNOSIS — M25561 Pain in right knee: Secondary | ICD-10-CM | POA: Diagnosis not present

## 2017-07-13 DIAGNOSIS — Z9181 History of falling: Secondary | ICD-10-CM | POA: Diagnosis not present

## 2017-07-13 DIAGNOSIS — M25562 Pain in left knee: Secondary | ICD-10-CM | POA: Diagnosis not present

## 2017-07-15 DIAGNOSIS — Z9181 History of falling: Secondary | ICD-10-CM | POA: Diagnosis not present

## 2017-07-15 DIAGNOSIS — M25561 Pain in right knee: Secondary | ICD-10-CM | POA: Diagnosis not present

## 2017-07-15 DIAGNOSIS — I10 Essential (primary) hypertension: Secondary | ICD-10-CM | POA: Diagnosis not present

## 2017-07-15 DIAGNOSIS — Z7982 Long term (current) use of aspirin: Secondary | ICD-10-CM | POA: Diagnosis not present

## 2017-07-15 DIAGNOSIS — M25562 Pain in left knee: Secondary | ICD-10-CM | POA: Diagnosis not present

## 2017-07-16 DIAGNOSIS — Z7982 Long term (current) use of aspirin: Secondary | ICD-10-CM | POA: Diagnosis not present

## 2017-07-16 DIAGNOSIS — Z9181 History of falling: Secondary | ICD-10-CM | POA: Diagnosis not present

## 2017-07-16 DIAGNOSIS — M25561 Pain in right knee: Secondary | ICD-10-CM | POA: Diagnosis not present

## 2017-07-16 DIAGNOSIS — M25562 Pain in left knee: Secondary | ICD-10-CM | POA: Diagnosis not present

## 2017-07-16 DIAGNOSIS — I10 Essential (primary) hypertension: Secondary | ICD-10-CM | POA: Diagnosis not present

## 2017-07-20 DIAGNOSIS — E039 Hypothyroidism, unspecified: Secondary | ICD-10-CM | POA: Diagnosis not present

## 2017-07-20 DIAGNOSIS — M25562 Pain in left knee: Secondary | ICD-10-CM | POA: Diagnosis not present

## 2017-07-20 DIAGNOSIS — E785 Hyperlipidemia, unspecified: Secondary | ICD-10-CM | POA: Diagnosis not present

## 2017-07-20 DIAGNOSIS — I509 Heart failure, unspecified: Secondary | ICD-10-CM | POA: Diagnosis not present

## 2017-07-20 DIAGNOSIS — J45998 Other asthma: Secondary | ICD-10-CM | POA: Diagnosis not present

## 2017-07-20 DIAGNOSIS — E559 Vitamin D deficiency, unspecified: Secondary | ICD-10-CM | POA: Diagnosis not present

## 2017-07-20 DIAGNOSIS — Z7982 Long term (current) use of aspirin: Secondary | ICD-10-CM | POA: Diagnosis not present

## 2017-07-20 DIAGNOSIS — E1165 Type 2 diabetes mellitus with hyperglycemia: Secondary | ICD-10-CM | POA: Diagnosis not present

## 2017-07-20 DIAGNOSIS — Z9181 History of falling: Secondary | ICD-10-CM | POA: Diagnosis not present

## 2017-07-20 DIAGNOSIS — M25561 Pain in right knee: Secondary | ICD-10-CM | POA: Diagnosis not present

## 2017-07-20 DIAGNOSIS — I10 Essential (primary) hypertension: Secondary | ICD-10-CM | POA: Diagnosis not present

## 2017-07-22 DIAGNOSIS — Z9181 History of falling: Secondary | ICD-10-CM | POA: Diagnosis not present

## 2017-07-22 DIAGNOSIS — I10 Essential (primary) hypertension: Secondary | ICD-10-CM | POA: Diagnosis not present

## 2017-07-22 DIAGNOSIS — M25561 Pain in right knee: Secondary | ICD-10-CM | POA: Diagnosis not present

## 2017-07-22 DIAGNOSIS — Z7982 Long term (current) use of aspirin: Secondary | ICD-10-CM | POA: Diagnosis not present

## 2017-07-22 DIAGNOSIS — M25562 Pain in left knee: Secondary | ICD-10-CM | POA: Diagnosis not present

## 2017-07-23 DIAGNOSIS — M25562 Pain in left knee: Secondary | ICD-10-CM | POA: Diagnosis not present

## 2017-07-23 DIAGNOSIS — M25561 Pain in right knee: Secondary | ICD-10-CM | POA: Diagnosis not present

## 2017-07-23 DIAGNOSIS — Z7982 Long term (current) use of aspirin: Secondary | ICD-10-CM | POA: Diagnosis not present

## 2017-07-23 DIAGNOSIS — Z9181 History of falling: Secondary | ICD-10-CM | POA: Diagnosis not present

## 2017-07-23 DIAGNOSIS — I10 Essential (primary) hypertension: Secondary | ICD-10-CM | POA: Diagnosis not present

## 2017-07-29 DIAGNOSIS — M25562 Pain in left knee: Secondary | ICD-10-CM | POA: Diagnosis not present

## 2017-07-29 DIAGNOSIS — Z7982 Long term (current) use of aspirin: Secondary | ICD-10-CM | POA: Diagnosis not present

## 2017-07-29 DIAGNOSIS — Z9181 History of falling: Secondary | ICD-10-CM | POA: Diagnosis not present

## 2017-07-29 DIAGNOSIS — M25561 Pain in right knee: Secondary | ICD-10-CM | POA: Diagnosis not present

## 2017-07-29 DIAGNOSIS — I10 Essential (primary) hypertension: Secondary | ICD-10-CM | POA: Diagnosis not present

## 2017-07-30 DIAGNOSIS — Z9181 History of falling: Secondary | ICD-10-CM | POA: Diagnosis not present

## 2017-07-30 DIAGNOSIS — Z7982 Long term (current) use of aspirin: Secondary | ICD-10-CM | POA: Diagnosis not present

## 2017-07-30 DIAGNOSIS — M25562 Pain in left knee: Secondary | ICD-10-CM | POA: Diagnosis not present

## 2017-07-30 DIAGNOSIS — I10 Essential (primary) hypertension: Secondary | ICD-10-CM | POA: Diagnosis not present

## 2017-07-30 DIAGNOSIS — M25561 Pain in right knee: Secondary | ICD-10-CM | POA: Diagnosis not present

## 2017-08-05 DIAGNOSIS — E119 Type 2 diabetes mellitus without complications: Secondary | ICD-10-CM | POA: Diagnosis not present

## 2017-08-05 DIAGNOSIS — E785 Hyperlipidemia, unspecified: Secondary | ICD-10-CM | POA: Diagnosis not present

## 2017-08-05 DIAGNOSIS — H25813 Combined forms of age-related cataract, bilateral: Secondary | ICD-10-CM | POA: Diagnosis not present

## 2017-08-05 DIAGNOSIS — H527 Unspecified disorder of refraction: Secondary | ICD-10-CM | POA: Diagnosis not present

## 2017-08-05 DIAGNOSIS — H43813 Vitreous degeneration, bilateral: Secondary | ICD-10-CM | POA: Diagnosis not present

## 2017-08-05 DIAGNOSIS — H52223 Regular astigmatism, bilateral: Secondary | ICD-10-CM | POA: Diagnosis not present

## 2017-08-05 DIAGNOSIS — H35373 Puckering of macula, bilateral: Secondary | ICD-10-CM | POA: Diagnosis not present

## 2017-08-18 DIAGNOSIS — M47816 Spondylosis without myelopathy or radiculopathy, lumbar region: Secondary | ICD-10-CM | POA: Diagnosis not present

## 2017-08-18 DIAGNOSIS — G894 Chronic pain syndrome: Secondary | ICD-10-CM | POA: Diagnosis not present

## 2017-08-18 DIAGNOSIS — M170 Bilateral primary osteoarthritis of knee: Secondary | ICD-10-CM | POA: Diagnosis not present

## 2017-08-18 DIAGNOSIS — M545 Low back pain: Secondary | ICD-10-CM | POA: Diagnosis not present

## 2017-08-18 DIAGNOSIS — M5136 Other intervertebral disc degeneration, lumbar region: Secondary | ICD-10-CM | POA: Diagnosis not present

## 2017-09-01 DIAGNOSIS — J45998 Other asthma: Secondary | ICD-10-CM | POA: Diagnosis not present

## 2017-09-01 DIAGNOSIS — E039 Hypothyroidism, unspecified: Secondary | ICD-10-CM | POA: Diagnosis not present

## 2017-09-01 DIAGNOSIS — D1721 Benign lipomatous neoplasm of skin and subcutaneous tissue of right arm: Secondary | ICD-10-CM | POA: Diagnosis not present

## 2017-09-01 DIAGNOSIS — I509 Heart failure, unspecified: Secondary | ICD-10-CM | POA: Diagnosis not present

## 2017-09-01 DIAGNOSIS — E1165 Type 2 diabetes mellitus with hyperglycemia: Secondary | ICD-10-CM | POA: Diagnosis not present

## 2017-09-01 DIAGNOSIS — E785 Hyperlipidemia, unspecified: Secondary | ICD-10-CM | POA: Diagnosis not present

## 2017-09-01 DIAGNOSIS — E559 Vitamin D deficiency, unspecified: Secondary | ICD-10-CM | POA: Diagnosis not present

## 2017-09-01 DIAGNOSIS — I10 Essential (primary) hypertension: Secondary | ICD-10-CM | POA: Diagnosis not present

## 2017-09-08 DIAGNOSIS — H52223 Regular astigmatism, bilateral: Secondary | ICD-10-CM | POA: Diagnosis not present

## 2017-09-08 DIAGNOSIS — H25813 Combined forms of age-related cataract, bilateral: Secondary | ICD-10-CM | POA: Diagnosis not present

## 2017-09-09 DIAGNOSIS — I10 Essential (primary) hypertension: Secondary | ICD-10-CM | POA: Diagnosis not present

## 2017-09-09 DIAGNOSIS — E119 Type 2 diabetes mellitus without complications: Secondary | ICD-10-CM | POA: Diagnosis not present

## 2017-09-14 ENCOUNTER — Emergency Department (HOSPITAL_BASED_OUTPATIENT_CLINIC_OR_DEPARTMENT_OTHER)
Admission: EM | Admit: 2017-09-14 | Discharge: 2017-09-14 | Disposition: A | Discharge status: Home / Self Care | Payer: Medicare HMO | Attending: Emergency Medicine | Admitting: Emergency Medicine

## 2017-09-14 ENCOUNTER — Other Ambulatory Visit: Payer: Self-pay

## 2017-09-14 ENCOUNTER — Emergency Department (HOSPITAL_BASED_OUTPATIENT_CLINIC_OR_DEPARTMENT_OTHER): Payer: Medicare HMO

## 2017-09-14 ENCOUNTER — Encounter (HOSPITAL_BASED_OUTPATIENT_CLINIC_OR_DEPARTMENT_OTHER): Payer: Self-pay

## 2017-09-14 DIAGNOSIS — I10 Essential (primary) hypertension: Secondary | ICD-10-CM | POA: Diagnosis not present

## 2017-09-14 DIAGNOSIS — Z79899 Other long term (current) drug therapy: Secondary | ICD-10-CM | POA: Diagnosis not present

## 2017-09-14 DIAGNOSIS — J069 Acute upper respiratory infection, unspecified: Secondary | ICD-10-CM | POA: Diagnosis not present

## 2017-09-14 DIAGNOSIS — H25811 Combined forms of age-related cataract, right eye: Secondary | ICD-10-CM | POA: Insufficient documentation

## 2017-09-14 DIAGNOSIS — J4541 Moderate persistent asthma with (acute) exacerbation: Secondary | ICD-10-CM | POA: Insufficient documentation

## 2017-09-14 DIAGNOSIS — E039 Hypothyroidism, unspecified: Secondary | ICD-10-CM | POA: Insufficient documentation

## 2017-09-14 DIAGNOSIS — R0789 Other chest pain: Secondary | ICD-10-CM | POA: Insufficient documentation

## 2017-09-14 DIAGNOSIS — E119 Type 2 diabetes mellitus without complications: Secondary | ICD-10-CM | POA: Insufficient documentation

## 2017-09-14 DIAGNOSIS — R51 Headache: Secondary | ICD-10-CM | POA: Diagnosis not present

## 2017-09-14 DIAGNOSIS — B9789 Other viral agents as the cause of diseases classified elsewhere: Secondary | ICD-10-CM

## 2017-09-14 DIAGNOSIS — R05 Cough: Secondary | ICD-10-CM | POA: Diagnosis not present

## 2017-09-14 LAB — CBC
HEMATOCRIT: 39.8 % (ref 36.0–46.0)
HEMOGLOBIN: 13.1 g/dL (ref 12.0–15.0)
MCH: 29.8 pg (ref 26.0–34.0)
MCHC: 32.9 g/dL (ref 30.0–36.0)
MCV: 90.5 fL (ref 78.0–100.0)
Platelets: 267 10*3/uL (ref 150–400)
RBC: 4.4 MIL/uL (ref 3.87–5.11)
RDW: 13.6 % (ref 11.5–15.5)
WBC: 7.2 10*3/uL (ref 4.0–10.5)

## 2017-09-14 LAB — COMPREHENSIVE METABOLIC PANEL
ALK PHOS: 127 U/L — AB (ref 38–126)
ALT: 12 U/L — ABNORMAL LOW (ref 14–54)
AST: 15 U/L (ref 15–41)
Albumin: 3.2 g/dL — ABNORMAL LOW (ref 3.5–5.0)
Anion gap: 10 (ref 5–15)
BILIRUBIN TOTAL: 0.4 mg/dL (ref 0.3–1.2)
BUN: 18 mg/dL (ref 6–20)
CO2: 24 mmol/L (ref 22–32)
Calcium: 10.5 mg/dL — ABNORMAL HIGH (ref 8.9–10.3)
Chloride: 107 mmol/L (ref 101–111)
Creatinine, Ser: 0.91 mg/dL (ref 0.44–1.00)
GFR calc Af Amer: >60 mL/min (ref >60)
GLUCOSE: 170 mg/dL — AB (ref 65–99)
POTASSIUM: 3.3 mmol/L — AB (ref 3.5–5.1)
Sodium: 141 mmol/L (ref 135–145)
TOTAL PROTEIN: 7.5 g/dL (ref 6.5–8.1)

## 2017-09-14 LAB — BRAIN NATRIURETIC PEPTIDE: B Natriuretic Peptide: 35.9 pg/mL (ref 0.0–100.0)

## 2017-09-14 LAB — TROPONIN I: Troponin I: <0.03 ng/mL (ref <0.03)

## 2017-09-14 LAB — CBG MONITORING, ED: Glucose-Capillary: 150 mg/dL — ABNORMAL HIGH (ref 65–99)

## 2017-09-14 MED ORDER — IPRATROPIUM-ALBUTEROL 0.5-2.5 (3) MG/3ML IN SOLN
RESPIRATORY_TRACT | Status: AC
  Administered 2017-09-14: 16:00:00 3 mL via RESPIRATORY_TRACT
  Filled 2017-09-14: qty 3

## 2017-09-14 MED ORDER — PREDNISONE 20 MG PO TABS: 40.0000 mg | ORAL_TABLET | Freq: Every day | ORAL | 0 refills | Status: AC

## 2017-09-14 MED ORDER — ALBUTEROL SULFATE (2.5 MG/3ML) 0.083% IN NEBU: 2.5000 mg | INHALATION_SOLUTION | Freq: Four times a day (QID) | RESPIRATORY_TRACT | 2 refills | Status: AC | PRN

## 2017-09-14 MED ORDER — ALBUTEROL SULFATE (2.5 MG/3ML) 0.083% IN NEBU: 5.0000 mg | INHALATION_SOLUTION | Freq: Once | RESPIRATORY_TRACT | Status: DC

## 2017-09-14 MED ORDER — IPRATROPIUM-ALBUTEROL 0.5-2.5 (3) MG/3ML IN SOLN
3.0000 mL | Freq: Once | RESPIRATORY_TRACT | Status: AC
  Administered 2017-09-14: 3 mL via RESPIRATORY_TRACT

## 2017-09-14 MED ORDER — ALBUTEROL SULFATE (2.5 MG/3ML) 0.083% IN NEBU
INHALATION_SOLUTION | RESPIRATORY_TRACT | Status: AC
  Administered 2017-09-14: 16:00:00 2.5 mg via RESPIRATORY_TRACT
  Filled 2017-09-14: qty 3

## 2017-09-14 MED ORDER — ALBUTEROL SULFATE (2.5 MG/3ML) 0.083% IN NEBU
2.5000 mg | INHALATION_SOLUTION | Freq: Once | RESPIRATORY_TRACT | Status: AC
  Administered 2017-09-14: 2.5 mg via RESPIRATORY_TRACT

## 2017-09-14 MED ORDER — BENZONATATE 100 MG PO CAPS: 100.0000 mg | ORAL_CAPSULE | Freq: Three times a day (TID) | ORAL | 0 refills | Status: AC

## 2017-09-14 NOTE — ED Triage Notes (Signed)
C/o flu like sx started last week-presents to triage in w/c-NAD-DOE when up to stand on scale

## 2017-09-14 NOTE — ED Notes (Signed)
Patient transported to X-ray 

## 2017-09-14 NOTE — ED Provider Notes (Signed)
Medical screening examination/treatment/procedure(s) were conducted as a shared visit with non-physician practitioner(s) and myself.  I personally evaluated the patient during the encounter.  EKG Interpretation  Date/Time:  Monday Sep 14 2017 18:07:34 EDT Ventricular Rate:  79 PR Interval:    QRS Duration: 95 QT Interval:  379 QTC Calculation: 435 R Axis:   -31 Text Interpretation:  Sinus rhythm n No acute changes Confirmed by Charlesetta Shanks [phone]) on 09/14/2017 7:17:24 PM Patient started with some upper respiratory symptoms last week.  She reports that her grandson has had a bad cold.  She started coughing and wheezing.  She reports she is gotten more short of breath with activity.  Patient is alert and appropriate.  No acute distress.  Mental status clear.  Expiratory wheeze diffusely.  Good airflow to the bases.  Heart regular no gross rub murmur gallop.  I agree with plan of management.   Charlesetta Shanks, MD 09/14/17 [phone]

## 2017-09-14 NOTE — ED Notes (Signed)
Pt ambulated with walker and Spo2 started at 99% then dropped to 91% while ambulating and then 100% while Pt stopped to catch her breath. Pt stated she felt SOB and some wheezzing. Heart rate was 79-89.

## 2017-09-14 NOTE — ED Notes (Signed)
ED Provider at bedside. 

## 2017-09-14 NOTE — ED Notes (Signed)
Family at bedside. 

## 2017-09-14 NOTE — Discharge Instructions (Signed)
Thank you for allowing me to provide your care today in the emergency department.  Please call your ophthalmologist first thing in the morning to let them know that you were seen tonight in the emergency department to make sure they have no reason that you are not able to have your cataract procedure performed tomorrow.  Continue to use your albuterol nebulizer or 2 puffs of your albuterol inhaler with a spacer every 4 hours as needed for shortness of breath.  For cough, take 1 tablet of Tessalon Perles every 8 hours as needed.  You can also purchase Coricidin, which is available over-the-counter, which can help with cold and flulike symptoms but does not elevate your blood pressure.  If approved by ophthalmologist, you can start taking prednisone for your shortness of breath and asthma exacerbation.  Take 2 tablets daily for the next 4 days.  Please schedule follow-up appointment with your primary care provider to be re-evaluated within the next week.  Return to the emergency department if you develop worsening shortness of breath, chest pain or shortness of breath that becomes constant and worsens, feeling as if he cannot catch your breath, high fever, or other worsening symptoms.

## 2017-09-14 NOTE — ED Provider Notes (Signed)
[facility] EMERGENCY DEPARTMENT Provider Note   CSN: [id number] Arrival date & time: 09/14/17  1607     History   Chief Complaint Chief Complaint  Patient presents with  . Cough    HPI Nicole Morrison is a 67 y.o. female with a history of asthma, diabetes mellitus, OSA, and HTN who presents to the emergency department with dyspnea.  The patient endorses  intermittent dyspnea that began yesterday and has been worsening since onset.  Dyspnea is worse with exertion.  Improved with rest.  She reports associated wheezing, nonproductive cough, nasal congestion, sore throat, bilateral otalgia, and headache.  She reports  wheezing and nonproductive cough are worse at night.  She reports subjective fever yesterday, which has improved today.  She also endorses intermittent episodes of left-sided chest pain that radiates to her central chest.  She states the episodes will last for 1 to 2 minutes and will resolve with rest.  No history of similar.  She reports she has been under an increased amount of stress over the last few months and feels that the episodes come on when she is feeling more stressed or overexerting herself.  She states that she sleeps with 3 pillows at baseline, no change recently.  She denies palpitations, leg swelling, orthopnea, abdominal pain, nausea, vomiting, diarrhea, neck pain or stiffness, or rash.  She has been treating her symptoms at home with her albuterol inhaler with her spacer and albuterol nebulizer treatment with some improvement.  She is also been using Sudafed and Alka-Seltzer.  Sick contacts include her grandson who she has been spending more time with over the last few days has been ill with URI symptoms.  She is scheduled for a cataract procedure tomorrow.  Reports she was previously seen by [facility] cardiology several years ago for preoperative clearance.  She describes what sounds like a nuclear stress test and echo, which she reports were both  normal at that time.   The history is provided by the patient. No language interpreter was used.    Past Medical History:  Diagnosis Date  . Allergic rhinitis, cause unspecified   . Hypothyroidism   . Type II or unspecified type diabetes mellitus without mention of complication, not stated as uncontrolled   . Unspecified asthma(493.90)   . Unspecified essential hypertension     Patient Active Problem List   Diagnosis Date Noted  . DM 07/13/2008  . OBSTRUCTIVE SLEEP APNEA 07/13/2008  . HYPERTENSION 07/13/2008  . ALLERGIC RHINITIS 07/13/2008  . ASTHMA 07/13/2008    Past Surgical History:  Procedure Laterality Date  . KNEE SURGERY    . LEFT HEART CATHETERIZATION WITH CORONARY ANGIOGRAM N/A 12/28/2012   Procedure: LEFT HEART CATHETERIZATION WITH CORONARY ANGIOGRAM;  Surgeon: Laverda Page, MD;  Location: [facility] CATH LAB;  Service: Cardiovascular;  Laterality: N/A;     OB History   None      Home Medications    Prior to Admission medications   Medication Sig Start Date End Date Taking? Authorizing Provider  albuterol (PROVENTIL) (2.5 MG/3ML) 0.083% nebulizer solution Take 3 mLs (2.5 mg total) by nebulization every 6 (six) hours as needed for wheezing or shortness of breath. 09/14/17   McDonald, Mia A, PA-C  amLODipine (NORVASC) 10 MG tablet Take 10 mg by mouth daily.    [provider]  benzonatate (TESSALON) 100 MG capsule Take 1 capsule (100 mg total) by mouth every 8 (eight) hours. 09/14/17   McDonald, Mia A, PA-C  esomeprazole (Ramos)  20 MG capsule Take 20 mg by mouth daily before breakfast.    [provider]  Fluticasone-Salmeterol (ADVAIR) 500-50 MCG/DOSE AEPB Inhale 1 puff into the lungs every 12 (twelve) hours.    [provider]  furosemide (LASIX) 40 MG tablet Take 40 mg by mouth 2 (two) times daily.    [provider]  hydrALAZINE (APRESOLINE) 50 MG tablet Take 50 mg by mouth 3 (three) times daily.    [provider]    ibuprofen (ADVIL,MOTRIN) 200 MG tablet Take 200 mg by mouth every 8 (eight) hours as needed for pain.    [provider]  levothyroxine (SYNTHROID, LEVOTHROID) 150 MCG tablet Take 150 mcg by mouth daily before breakfast.    [provider]  losartan (COZAAR) 100 MG tablet Take 100 mg by mouth daily.    [provider]  metoprolol succinate (TOPROL-XL) 100 MG 24 hr tablet Take 100 mg by mouth 2 (two) times daily. Take with or immediately following a meal.    [provider]  potassium chloride SA (K-DUR,KLOR-CON) 20 MEQ tablet Take 20 mEq by mouth 2 (two) times daily.    [provider]  predniSONE (DELTASONE) 20 MG tablet Take 2 tablets (40 mg total) by mouth daily with breakfast for 4 days. 09/14/17 09/18/17  McDonald, Mia A, PA-C  Tetrahydrozoline HCl (VISINE OP) Apply 1-2 drops to eye daily as needed. For allergies    [provider]  tolterodine (DETROL LA) 4 MG 24 hr capsule Take 4 mg by mouth daily.    [provider]    Family History Family History  Problem Relation Age of Onset  . Heart disease Mother   . Ovarian cancer Mother   . Sleep apnea Father     Social History Social History   Tobacco Use  . Smoking status: Never Smoker  . Smokeless tobacco: Never Used  Substance Use Topics  . Alcohol use: Yes    Comment: occ  . Drug use: Never     Allergies   Tomato   Review of Systems Review of Systems  Constitutional: Positive for fever. Negative for activity change and chills.  HENT: Positive for congestion, ear pain and sore throat. Negative for ear discharge, sinus pressure and sinus pain.   Eyes: Negative for visual disturbance.  Respiratory: Positive for cough, shortness of breath and wheezing.   Cardiovascular: Positive for chest pain. Negative for palpitations and leg swelling.  Gastrointestinal: Negative for abdominal pain, diarrhea, nausea and vomiting.  Genitourinary: Negative for dysuria.   Musculoskeletal: Negative for back pain.  Skin: Negative for rash.  Allergic/Immunologic: Negative for immunocompromised state.  Neurological: Positive for headaches. Negative for dizziness, weakness and numbness.  Psychiatric/Behavioral: Negative for confusion.     Physical Exam Updated Vital Signs BP (!) 146/84 (BP Location: Right Arm)   Pulse 80   Temp 98.1 F (36.7 C) (Oral)   Resp 20   Ht 5\' 6"  (1.676 m)   Wt 131.1 kg (289 lb)   SpO2 98%   BMI 46.65 kg/m   Physical Exam  Constitutional: No distress.  Obese female.   HENT:  Head: Normocephalic.  Right Ear: Hearing, external ear and ear canal normal. Tympanic membrane is not erythematous and not bulging. A middle ear effusion is present.  Left Ear: Hearing, external ear and ear canal normal. Tympanic membrane is not erythematous and not bulging. A middle ear effusion is present.  Nose: Mucosal edema and rhinorrhea present. Right sinus exhibits no maxillary  sinus tenderness and no frontal sinus tenderness. Left sinus exhibits no maxillary sinus tenderness and no frontal sinus tenderness.  Mouth/Throat: Uvula is midline and mucous membranes are normal. No trismus in the jaw. No uvula swelling. Posterior oropharyngeal erythema present. No oropharyngeal exudate or posterior oropharyngeal edema. No tonsillar exudate.  Eyes: Pupils are equal, round, and reactive to light. Conjunctivae and EOM are normal. No scleral icterus.  Neck: Normal range of motion. Neck supple. No JVD present.  Cardiovascular: Normal rate, regular rhythm, normal heart sounds and intact distal pulses. Exam reveals no gallop and no friction rub.  No murmur heard. Pulmonary/Chest: Effort normal. No stridor. No respiratory distress. She has wheezes. She has no rales.  Expiratory and expiratory wheezes throughout in all fields on exam.  No retractions, accessory muscle use, or nasal flaring.  Abdominal: Soft. She exhibits no distension.  Musculoskeletal: She  exhibits no edema or tenderness.  No lower extremity edema bilaterally.  Lymphadenopathy:    She has no cervical adenopathy.  Neurological: She is alert.  Skin: Skin is warm. No rash noted.  Psychiatric: Her behavior is normal.  Nursing note and vitals reviewed.    ED Treatments / Results  Labs (all labs ordered are listed, but only abnormal results are displayed) Labs Reviewed  COMPREHENSIVE METABOLIC PANEL - Abnormal; Notable for the following components:      Result Value   Potassium 3.3 (*)    Glucose, Bld 170 (*)    Calcium 10.5 (*)    Albumin 3.2 (*)    ALT 12 (*)    Alkaline Phosphatase 127 (*)    All other components within normal limits  CBG MONITORING, ED - Abnormal; Notable for the following components:   Glucose-Capillary 150 (*)    All other components within normal limits  TROPONIN I  CBC  BRAIN NATRIURETIC PEPTIDE    EKG EKG Interpretation  Date/Time:  Monday Sep 14 2017 18:07:34 EDT Ventricular Rate:  79 PR Interval:    QRS Duration: 95 QT Interval:  379 QTC Calculation: 435 R Axis:   -31 Text Interpretation:  Sinus rhythm n No acute changes Confirmed by Charlesetta Shanks [phone]) on 09/14/2017 7:17:24 PM   Radiology Dg Chest 2 View  Result Date: 09/14/2017 CLINICAL DATA:  Pt c/o sob with cough x 1 week. Hx of htn and dm. EXAM: CHEST - 2 VIEW COMPARISON:  None. FINDINGS: Moderate thoracic spondylosis. Midline trachea. Borderline cardiomegaly. Mildly tortuous thoracic aorta. Mild left hemidiaphragm elevation. No pleural effusion or pneumothorax. Pulmonary interstitial prominence is favored to be due to low lung volumes and patient body habitus. No well-defined lobar consolidation. IMPRESSION: Borderline cardiomegaly with interstitial prominence, favored to be technique related. Mild pulmonary venous congestion could look similar. No overt congestive failure or convincing evidence of acute disease. Electronically Signed   By: Abigail Miyamoto M.D.   On:  09/14/2017 17:07    Procedures Procedures (including critical care time)  Medications Ordered in ED Medications  albuterol (PROVENTIL) (2.5 MG/3ML) 0.083% nebulizer solution 5 mg (has no administration in time range)  albuterol (PROVENTIL) (2.5 MG/3ML) 0.083% nebulizer solution 2.5 mg (2.5 mg Nebulization Given 09/14/17 1628)  ipratropium-albuterol (DUONEB) 0.5-2.5 (3) MG/3ML nebulizer solution 3 mL (3 mLs Nebulization Given 09/14/17 1628)     Initial Impression / Assessment and Plan / ED Course  I have reviewed the triage vital signs and the nursing notes.  Pertinent labs & imaging results that were available during my care of the patient were reviewed by me  and considered in my medical decision making (see chart for details).     67 year old female with a history of asthma, diabetes mellitus, OSA, and HTN who presents to the emergency department with dyspnea, URI symptoms, wheezing, and nonproductive cough.  She also endorses intermittent episodes of left-sided chest pain that radiates to the central chest that is worse with feeling anxious and activity and resolving with rest that began 2 weeks ago.  The patient was discussed with Dr. Vallery Ridge, attending physician.  The patient was given 1 nebulizer treatment by nursing staff prior to examination.  On exam she had diffuse expiratory and expiratory wheezes throughout all fields.  EKG with normal sinus rhythm.  X-ray with borderline cardiomegaly with interstitial prominence, thought to be technique related.  Troponin is negative.  Labs are otherwise reassuring.  Doubt ACS, pneumonia, PE, myocarditis, cardiac tamponade, or influenza.  On repeat exam following second nebulizer treatment, she has end expiratory wheezes in the bilateral bases, but lungs are otherwise clear to auscultation bilaterally.  Suspect asthma exacerbation in the setting of viral URI.  The patient is scheduled for a cataract procedure tomorrow morning.  She is requesting to  hold prednisone at this time.  Will send the patient home with an Rx for 4-day prednisone burst, cough suppressant, and refill of her albuterol nebulizers.  The patient was ambulated on pulse ox and initially dropped to 91%, but quickly improved to the mid to high 90s.  Heart rate remained steady in the 70s to 80s.  At this time, the patient is hemodynamically stable and safe for discharge.  Strict return precautions given.  Patient is hemodynamically stable and in no acute distress.  She is safe for discharge home at this time.  Final Clinical Impressions(s) / ED Diagnoses   Final diagnoses:  Moderate persistent asthma with exacerbation  Viral URI with cough    ED Discharge Orders        Ordered    predniSONE (DELTASONE) 20 MG tablet  Daily with breakfast     09/14/17 1919    albuterol (PROVENTIL) (2.5 MG/3ML) 0.083% nebulizer solution  Every 6 hours PRN     09/14/17 1919    benzonatate (TESSALON) 100 MG capsule  Every 8 hours     09/14/17 1919       McDonald, Laymond Purser, PA-C 09/14/17 2009    Charlesetta Shanks, MD 09/22/17 1448

## 2017-09-17 DIAGNOSIS — E785 Hyperlipidemia, unspecified: Secondary | ICD-10-CM | POA: Diagnosis not present

## 2017-09-17 DIAGNOSIS — E039 Hypothyroidism, unspecified: Secondary | ICD-10-CM | POA: Diagnosis not present

## 2017-09-17 DIAGNOSIS — E1165 Type 2 diabetes mellitus with hyperglycemia: Secondary | ICD-10-CM | POA: Diagnosis not present

## 2017-09-17 DIAGNOSIS — E559 Vitamin D deficiency, unspecified: Secondary | ICD-10-CM | POA: Diagnosis not present

## 2017-09-17 DIAGNOSIS — I509 Heart failure, unspecified: Secondary | ICD-10-CM | POA: Diagnosis not present

## 2017-09-17 DIAGNOSIS — J069 Acute upper respiratory infection, unspecified: Secondary | ICD-10-CM | POA: Diagnosis not present

## 2017-09-17 DIAGNOSIS — J45998 Other asthma: Secondary | ICD-10-CM | POA: Diagnosis not present

## 2017-09-17 DIAGNOSIS — I10 Essential (primary) hypertension: Secondary | ICD-10-CM | POA: Diagnosis not present

## 2017-10-13 DIAGNOSIS — L720 Epidermal cyst: Secondary | ICD-10-CM | POA: Diagnosis not present

## 2017-10-13 DIAGNOSIS — D1721 Benign lipomatous neoplasm of skin and subcutaneous tissue of right arm: Secondary | ICD-10-CM | POA: Diagnosis not present

## 2017-10-13 DIAGNOSIS — D1722 Benign lipomatous neoplasm of skin and subcutaneous tissue of left arm: Secondary | ICD-10-CM | POA: Diagnosis not present

## 2017-11-03 DIAGNOSIS — H25811 Combined forms of age-related cataract, right eye: Secondary | ICD-10-CM | POA: Diagnosis not present

## 2017-11-03 DIAGNOSIS — H43813 Vitreous degeneration, bilateral: Secondary | ICD-10-CM | POA: Diagnosis not present

## 2017-11-03 DIAGNOSIS — H25813 Combined forms of age-related cataract, bilateral: Secondary | ICD-10-CM | POA: Diagnosis not present

## 2017-11-03 DIAGNOSIS — Z79899 Other long term (current) drug therapy: Secondary | ICD-10-CM | POA: Diagnosis not present

## 2017-11-03 DIAGNOSIS — E039 Hypothyroidism, unspecified: Secondary | ICD-10-CM | POA: Diagnosis not present

## 2017-11-03 DIAGNOSIS — J45909 Unspecified asthma, uncomplicated: Secondary | ICD-10-CM | POA: Diagnosis not present

## 2017-11-03 DIAGNOSIS — M170 Bilateral primary osteoarthritis of knee: Secondary | ICD-10-CM | POA: Diagnosis not present

## 2017-11-03 DIAGNOSIS — E119 Type 2 diabetes mellitus without complications: Secondary | ICD-10-CM | POA: Diagnosis not present

## 2017-11-03 DIAGNOSIS — H52223 Regular astigmatism, bilateral: Secondary | ICD-10-CM | POA: Diagnosis not present

## 2017-11-04 DIAGNOSIS — Z961 Presence of intraocular lens: Secondary | ICD-10-CM | POA: Diagnosis not present

## 2017-11-04 DIAGNOSIS — H25812 Combined forms of age-related cataract, left eye: Secondary | ICD-10-CM | POA: Diagnosis not present

## 2017-11-04 DIAGNOSIS — E119 Type 2 diabetes mellitus without complications: Secondary | ICD-10-CM | POA: Diagnosis not present

## 2017-11-04 DIAGNOSIS — H35373 Puckering of macula, bilateral: Secondary | ICD-10-CM | POA: Diagnosis not present

## 2017-11-04 DIAGNOSIS — H52223 Regular astigmatism, bilateral: Secondary | ICD-10-CM | POA: Diagnosis not present

## 2017-11-04 DIAGNOSIS — H43813 Vitreous degeneration, bilateral: Secondary | ICD-10-CM | POA: Diagnosis not present

## 2017-11-04 DIAGNOSIS — H527 Unspecified disorder of refraction: Secondary | ICD-10-CM | POA: Diagnosis not present

## 2017-11-10 DIAGNOSIS — E039 Hypothyroidism, unspecified: Secondary | ICD-10-CM | POA: Diagnosis not present

## 2017-11-10 DIAGNOSIS — M170 Bilateral primary osteoarthritis of knee: Secondary | ICD-10-CM | POA: Diagnosis not present

## 2017-11-10 DIAGNOSIS — H43813 Vitreous degeneration, bilateral: Secondary | ICD-10-CM | POA: Diagnosis not present

## 2017-11-10 DIAGNOSIS — H35373 Puckering of macula, bilateral: Secondary | ICD-10-CM | POA: Diagnosis not present

## 2017-11-10 DIAGNOSIS — J45909 Unspecified asthma, uncomplicated: Secondary | ICD-10-CM | POA: Diagnosis not present

## 2017-11-10 DIAGNOSIS — H52223 Regular astigmatism, bilateral: Secondary | ICD-10-CM | POA: Diagnosis not present

## 2017-11-10 DIAGNOSIS — H25812 Combined forms of age-related cataract, left eye: Secondary | ICD-10-CM | POA: Diagnosis not present

## 2017-11-10 DIAGNOSIS — I10 Essential (primary) hypertension: Secondary | ICD-10-CM | POA: Diagnosis not present

## 2017-11-11 DIAGNOSIS — H52223 Regular astigmatism, bilateral: Secondary | ICD-10-CM | POA: Diagnosis not present

## 2017-11-11 DIAGNOSIS — Z961 Presence of intraocular lens: Secondary | ICD-10-CM | POA: Diagnosis not present

## 2017-11-11 DIAGNOSIS — H35373 Puckering of macula, bilateral: Secondary | ICD-10-CM | POA: Diagnosis not present

## 2017-11-11 DIAGNOSIS — H527 Unspecified disorder of refraction: Secondary | ICD-10-CM | POA: Diagnosis not present

## 2017-11-11 DIAGNOSIS — E119 Type 2 diabetes mellitus without complications: Secondary | ICD-10-CM | POA: Diagnosis not present

## 2017-11-11 DIAGNOSIS — H43813 Vitreous degeneration, bilateral: Secondary | ICD-10-CM | POA: Diagnosis not present

## 2017-12-14 DIAGNOSIS — M170 Bilateral primary osteoarthritis of knee: Secondary | ICD-10-CM | POA: Diagnosis not present

## 2017-12-14 DIAGNOSIS — G894 Chronic pain syndrome: Secondary | ICD-10-CM | POA: Diagnosis not present

## 2017-12-17 DIAGNOSIS — E1165 Type 2 diabetes mellitus with hyperglycemia: Secondary | ICD-10-CM | POA: Diagnosis not present

## 2017-12-17 DIAGNOSIS — E559 Vitamin D deficiency, unspecified: Secondary | ICD-10-CM | POA: Diagnosis not present

## 2017-12-17 DIAGNOSIS — Z0110 Encounter for examination of ears and hearing without abnormal findings: Secondary | ICD-10-CM | POA: Diagnosis not present

## 2017-12-17 DIAGNOSIS — Z0001 Encounter for general adult medical examination with abnormal findings: Secondary | ICD-10-CM | POA: Diagnosis not present

## 2017-12-17 DIAGNOSIS — Z136 Encounter for screening for cardiovascular disorders: Secondary | ICD-10-CM | POA: Diagnosis not present

## 2017-12-17 DIAGNOSIS — Z0000 Encounter for general adult medical examination without abnormal findings: Secondary | ICD-10-CM | POA: Diagnosis not present

## 2017-12-17 DIAGNOSIS — E039 Hypothyroidism, unspecified: Secondary | ICD-10-CM | POA: Diagnosis not present

## 2017-12-17 DIAGNOSIS — I10 Essential (primary) hypertension: Secondary | ICD-10-CM | POA: Diagnosis not present

## 2017-12-17 DIAGNOSIS — E785 Hyperlipidemia, unspecified: Secondary | ICD-10-CM | POA: Diagnosis not present

## 2018-01-18 DIAGNOSIS — Z23 Encounter for immunization: Secondary | ICD-10-CM | POA: Diagnosis not present

## 2018-01-18 DIAGNOSIS — E785 Hyperlipidemia, unspecified: Secondary | ICD-10-CM | POA: Diagnosis not present

## 2018-01-18 DIAGNOSIS — E039 Hypothyroidism, unspecified: Secondary | ICD-10-CM | POA: Diagnosis not present

## 2018-01-18 DIAGNOSIS — E1165 Type 2 diabetes mellitus with hyperglycemia: Secondary | ICD-10-CM | POA: Diagnosis not present

## 2018-01-18 DIAGNOSIS — Z0001 Encounter for general adult medical examination with abnormal findings: Secondary | ICD-10-CM | POA: Diagnosis not present

## 2018-01-18 DIAGNOSIS — E559 Vitamin D deficiency, unspecified: Secondary | ICD-10-CM | POA: Diagnosis not present

## 2018-01-18 DIAGNOSIS — I10 Essential (primary) hypertension: Secondary | ICD-10-CM | POA: Diagnosis not present

## 2018-01-18 DIAGNOSIS — I509 Heart failure, unspecified: Secondary | ICD-10-CM | POA: Diagnosis not present

## 2018-01-19 DIAGNOSIS — Z7951 Long term (current) use of inhaled steroids: Secondary | ICD-10-CM | POA: Diagnosis not present

## 2018-01-19 DIAGNOSIS — J45909 Unspecified asthma, uncomplicated: Secondary | ICD-10-CM | POA: Diagnosis not present

## 2018-01-19 DIAGNOSIS — Z79899 Other long term (current) drug therapy: Secondary | ICD-10-CM | POA: Diagnosis not present

## 2018-01-19 DIAGNOSIS — I10 Essential (primary) hypertension: Secondary | ICD-10-CM | POA: Diagnosis not present

## 2018-01-19 DIAGNOSIS — M170 Bilateral primary osteoarthritis of knee: Secondary | ICD-10-CM | POA: Diagnosis not present

## 2018-01-19 DIAGNOSIS — E785 Hyperlipidemia, unspecified: Secondary | ICD-10-CM | POA: Diagnosis not present

## 2018-01-19 DIAGNOSIS — Z7982 Long term (current) use of aspirin: Secondary | ICD-10-CM | POA: Diagnosis not present

## 2018-01-19 DIAGNOSIS — M1712 Unilateral primary osteoarthritis, left knee: Secondary | ICD-10-CM | POA: Diagnosis not present

## 2018-02-10 DIAGNOSIS — G894 Chronic pain syndrome: Secondary | ICD-10-CM | POA: Diagnosis not present

## 2018-02-10 DIAGNOSIS — M170 Bilateral primary osteoarthritis of knee: Secondary | ICD-10-CM | POA: Diagnosis not present

## 2018-02-10 DIAGNOSIS — M5136 Other intervertebral disc degeneration, lumbar region: Secondary | ICD-10-CM | POA: Diagnosis not present

## 2018-02-10 DIAGNOSIS — M47816 Spondylosis without myelopathy or radiculopathy, lumbar region: Secondary | ICD-10-CM | POA: Diagnosis not present

## 2018-02-22 DIAGNOSIS — Z1231 Encounter for screening mammogram for malignant neoplasm of breast: Secondary | ICD-10-CM | POA: Diagnosis not present

## 2018-02-23 DIAGNOSIS — E559 Vitamin D deficiency, unspecified: Secondary | ICD-10-CM | POA: Diagnosis not present

## 2018-02-23 DIAGNOSIS — I10 Essential (primary) hypertension: Secondary | ICD-10-CM | POA: Diagnosis not present

## 2018-02-23 DIAGNOSIS — E039 Hypothyroidism, unspecified: Secondary | ICD-10-CM | POA: Diagnosis not present

## 2018-02-23 DIAGNOSIS — E1165 Type 2 diabetes mellitus with hyperglycemia: Secondary | ICD-10-CM | POA: Diagnosis not present

## 2018-02-23 DIAGNOSIS — J302 Other seasonal allergic rhinitis: Secondary | ICD-10-CM | POA: Diagnosis not present

## 2018-02-23 DIAGNOSIS — I509 Heart failure, unspecified: Secondary | ICD-10-CM | POA: Diagnosis not present

## 2018-02-23 DIAGNOSIS — E785 Hyperlipidemia, unspecified: Secondary | ICD-10-CM | POA: Diagnosis not present

## 2018-02-23 DIAGNOSIS — J4530 Mild persistent asthma, uncomplicated: Secondary | ICD-10-CM | POA: Diagnosis not present

## 2018-03-25 DIAGNOSIS — R6889 Other general symptoms and signs: Secondary | ICD-10-CM | POA: Diagnosis not present

## 2018-03-25 DIAGNOSIS — R2689 Other abnormalities of gait and mobility: Secondary | ICD-10-CM | POA: Diagnosis not present

## 2018-03-25 DIAGNOSIS — I10 Essential (primary) hypertension: Secondary | ICD-10-CM | POA: Diagnosis not present

## 2018-03-25 DIAGNOSIS — M47816 Spondylosis without myelopathy or radiculopathy, lumbar region: Secondary | ICD-10-CM | POA: Diagnosis not present

## 2018-03-25 DIAGNOSIS — M170 Bilateral primary osteoarthritis of knee: Secondary | ICD-10-CM | POA: Diagnosis not present

## 2018-03-25 DIAGNOSIS — R7303 Prediabetes: Secondary | ICD-10-CM | POA: Diagnosis not present

## 2018-03-31 DIAGNOSIS — M47816 Spondylosis without myelopathy or radiculopathy, lumbar region: Secondary | ICD-10-CM | POA: Diagnosis not present

## 2018-03-31 DIAGNOSIS — M170 Bilateral primary osteoarthritis of knee: Secondary | ICD-10-CM | POA: Diagnosis not present

## 2018-03-31 DIAGNOSIS — M5136 Other intervertebral disc degeneration, lumbar region: Secondary | ICD-10-CM | POA: Diagnosis not present

## 2018-04-06 DIAGNOSIS — E119 Type 2 diabetes mellitus without complications: Secondary | ICD-10-CM | POA: Diagnosis not present

## 2018-04-06 DIAGNOSIS — E039 Hypothyroidism, unspecified: Secondary | ICD-10-CM | POA: Diagnosis not present

## 2018-04-06 DIAGNOSIS — E785 Hyperlipidemia, unspecified: Secondary | ICD-10-CM | POA: Diagnosis not present

## 2018-04-06 DIAGNOSIS — E1165 Type 2 diabetes mellitus with hyperglycemia: Secondary | ICD-10-CM | POA: Diagnosis not present

## 2018-04-06 DIAGNOSIS — E559 Vitamin D deficiency, unspecified: Secondary | ICD-10-CM | POA: Diagnosis not present

## 2018-04-06 DIAGNOSIS — I509 Heart failure, unspecified: Secondary | ICD-10-CM | POA: Diagnosis not present

## 2018-04-06 DIAGNOSIS — J4530 Mild persistent asthma, uncomplicated: Secondary | ICD-10-CM | POA: Diagnosis not present

## 2018-04-06 DIAGNOSIS — I10 Essential (primary) hypertension: Secondary | ICD-10-CM | POA: Diagnosis not present

## 2018-04-08 DIAGNOSIS — M5136 Other intervertebral disc degeneration, lumbar region: Secondary | ICD-10-CM | POA: Diagnosis not present

## 2018-04-08 DIAGNOSIS — M47816 Spondylosis without myelopathy or radiculopathy, lumbar region: Secondary | ICD-10-CM | POA: Diagnosis not present

## 2018-04-08 DIAGNOSIS — M170 Bilateral primary osteoarthritis of knee: Secondary | ICD-10-CM | POA: Diagnosis not present

## 2018-04-15 DIAGNOSIS — M5136 Other intervertebral disc degeneration, lumbar region: Secondary | ICD-10-CM | POA: Diagnosis not present

## 2018-04-15 DIAGNOSIS — M47816 Spondylosis without myelopathy or radiculopathy, lumbar region: Secondary | ICD-10-CM | POA: Diagnosis not present

## 2018-04-15 DIAGNOSIS — M170 Bilateral primary osteoarthritis of knee: Secondary | ICD-10-CM | POA: Diagnosis not present

## 2018-04-27 DIAGNOSIS — M47816 Spondylosis without myelopathy or radiculopathy, lumbar region: Secondary | ICD-10-CM | POA: Diagnosis not present

## 2018-04-27 DIAGNOSIS — M170 Bilateral primary osteoarthritis of knee: Secondary | ICD-10-CM | POA: Diagnosis not present

## 2018-04-27 DIAGNOSIS — R2689 Other abnormalities of gait and mobility: Secondary | ICD-10-CM | POA: Diagnosis not present

## 2018-04-27 DIAGNOSIS — R29898 Other symptoms and signs involving the musculoskeletal system: Secondary | ICD-10-CM | POA: Diagnosis not present

## 2018-04-29 DIAGNOSIS — M47816 Spondylosis without myelopathy or radiculopathy, lumbar region: Secondary | ICD-10-CM | POA: Diagnosis not present

## 2018-04-29 DIAGNOSIS — M170 Bilateral primary osteoarthritis of knee: Secondary | ICD-10-CM | POA: Diagnosis not present

## 2018-04-29 DIAGNOSIS — R2689 Other abnormalities of gait and mobility: Secondary | ICD-10-CM | POA: Diagnosis not present

## 2018-04-29 DIAGNOSIS — R29898 Other symptoms and signs involving the musculoskeletal system: Secondary | ICD-10-CM | POA: Diagnosis not present

## 2018-05-03 DIAGNOSIS — R2689 Other abnormalities of gait and mobility: Secondary | ICD-10-CM | POA: Diagnosis not present

## 2018-05-03 DIAGNOSIS — M47816 Spondylosis without myelopathy or radiculopathy, lumbar region: Secondary | ICD-10-CM | POA: Diagnosis not present

## 2018-05-03 DIAGNOSIS — R29898 Other symptoms and signs involving the musculoskeletal system: Secondary | ICD-10-CM | POA: Diagnosis not present

## 2018-05-03 DIAGNOSIS — M170 Bilateral primary osteoarthritis of knee: Secondary | ICD-10-CM | POA: Diagnosis not present

## 2018-05-06 DIAGNOSIS — R2689 Other abnormalities of gait and mobility: Secondary | ICD-10-CM | POA: Diagnosis not present

## 2018-05-06 DIAGNOSIS — R29898 Other symptoms and signs involving the musculoskeletal system: Secondary | ICD-10-CM | POA: Diagnosis not present

## 2018-05-06 DIAGNOSIS — M170 Bilateral primary osteoarthritis of knee: Secondary | ICD-10-CM | POA: Diagnosis not present

## 2018-05-06 DIAGNOSIS — M47816 Spondylosis without myelopathy or radiculopathy, lumbar region: Secondary | ICD-10-CM | POA: Diagnosis not present

## 2018-05-13 DIAGNOSIS — R2689 Other abnormalities of gait and mobility: Secondary | ICD-10-CM | POA: Diagnosis not present

## 2018-05-13 DIAGNOSIS — M170 Bilateral primary osteoarthritis of knee: Secondary | ICD-10-CM | POA: Diagnosis not present

## 2018-05-13 DIAGNOSIS — M47816 Spondylosis without myelopathy or radiculopathy, lumbar region: Secondary | ICD-10-CM | POA: Diagnosis not present

## 2018-05-13 DIAGNOSIS — R29898 Other symptoms and signs involving the musculoskeletal system: Secondary | ICD-10-CM | POA: Diagnosis not present

## 2018-05-25 DIAGNOSIS — R7303 Prediabetes: Secondary | ICD-10-CM | POA: Diagnosis not present

## 2018-05-25 DIAGNOSIS — I10 Essential (primary) hypertension: Secondary | ICD-10-CM | POA: Diagnosis not present

## 2018-05-25 DIAGNOSIS — M5136 Other intervertebral disc degeneration, lumbar region: Secondary | ICD-10-CM | POA: Diagnosis not present

## 2018-05-25 DIAGNOSIS — M47816 Spondylosis without myelopathy or radiculopathy, lumbar region: Secondary | ICD-10-CM | POA: Diagnosis not present

## 2018-05-25 DIAGNOSIS — G894 Chronic pain syndrome: Secondary | ICD-10-CM | POA: Diagnosis not present

## 2018-05-25 DIAGNOSIS — M170 Bilateral primary osteoarthritis of knee: Secondary | ICD-10-CM | POA: Diagnosis not present

## 2018-05-31 DIAGNOSIS — I10 Essential (primary) hypertension: Secondary | ICD-10-CM | POA: Diagnosis not present

## 2018-05-31 DIAGNOSIS — R7303 Prediabetes: Secondary | ICD-10-CM | POA: Diagnosis not present

## 2018-05-31 DIAGNOSIS — M5136 Other intervertebral disc degeneration, lumbar region: Secondary | ICD-10-CM | POA: Diagnosis not present

## 2018-05-31 DIAGNOSIS — M170 Bilateral primary osteoarthritis of knee: Secondary | ICD-10-CM | POA: Diagnosis not present

## 2018-05-31 DIAGNOSIS — M47816 Spondylosis without myelopathy or radiculopathy, lumbar region: Secondary | ICD-10-CM | POA: Diagnosis not present

## 2018-06-08 DIAGNOSIS — M5136 Other intervertebral disc degeneration, lumbar region: Secondary | ICD-10-CM | POA: Diagnosis not present

## 2018-06-08 DIAGNOSIS — I10 Essential (primary) hypertension: Secondary | ICD-10-CM | POA: Diagnosis not present

## 2018-06-08 DIAGNOSIS — M170 Bilateral primary osteoarthritis of knee: Secondary | ICD-10-CM | POA: Diagnosis not present

## 2018-06-08 DIAGNOSIS — R7303 Prediabetes: Secondary | ICD-10-CM | POA: Diagnosis not present

## 2018-06-08 DIAGNOSIS — M47816 Spondylosis without myelopathy or radiculopathy, lumbar region: Secondary | ICD-10-CM | POA: Diagnosis not present

## 2018-06-15 DIAGNOSIS — E119 Type 2 diabetes mellitus without complications: Secondary | ICD-10-CM | POA: Diagnosis not present

## 2018-06-17 DIAGNOSIS — M5136 Other intervertebral disc degeneration, lumbar region: Secondary | ICD-10-CM | POA: Diagnosis not present

## 2018-06-17 DIAGNOSIS — R7303 Prediabetes: Secondary | ICD-10-CM | POA: Diagnosis not present

## 2018-06-17 DIAGNOSIS — I10 Essential (primary) hypertension: Secondary | ICD-10-CM | POA: Diagnosis not present

## 2018-06-17 DIAGNOSIS — M170 Bilateral primary osteoarthritis of knee: Secondary | ICD-10-CM | POA: Diagnosis not present

## 2018-06-17 DIAGNOSIS — M47816 Spondylosis without myelopathy or radiculopathy, lumbar region: Secondary | ICD-10-CM | POA: Diagnosis not present

## 2018-06-25 DIAGNOSIS — R6889 Other general symptoms and signs: Secondary | ICD-10-CM | POA: Diagnosis not present

## 2018-06-25 DIAGNOSIS — R2689 Other abnormalities of gait and mobility: Secondary | ICD-10-CM | POA: Diagnosis not present

## 2018-06-25 DIAGNOSIS — M47816 Spondylosis without myelopathy or radiculopathy, lumbar region: Secondary | ICD-10-CM | POA: Diagnosis not present

## 2018-06-25 DIAGNOSIS — M170 Bilateral primary osteoarthritis of knee: Secondary | ICD-10-CM | POA: Diagnosis not present

## 2018-06-25 DIAGNOSIS — R29898 Other symptoms and signs involving the musculoskeletal system: Secondary | ICD-10-CM | POA: Diagnosis not present

## 2018-06-25 DIAGNOSIS — M5136 Other intervertebral disc degeneration, lumbar region: Secondary | ICD-10-CM | POA: Diagnosis not present

## 2018-07-06 DIAGNOSIS — E039 Hypothyroidism, unspecified: Secondary | ICD-10-CM | POA: Diagnosis not present

## 2018-07-06 DIAGNOSIS — I10 Essential (primary) hypertension: Secondary | ICD-10-CM | POA: Diagnosis not present

## 2018-07-06 DIAGNOSIS — J4530 Mild persistent asthma, uncomplicated: Secondary | ICD-10-CM | POA: Diagnosis not present

## 2018-07-06 DIAGNOSIS — E785 Hyperlipidemia, unspecified: Secondary | ICD-10-CM | POA: Diagnosis not present

## 2018-07-06 DIAGNOSIS — E1165 Type 2 diabetes mellitus with hyperglycemia: Secondary | ICD-10-CM | POA: Diagnosis not present

## 2018-07-06 DIAGNOSIS — E119 Type 2 diabetes mellitus without complications: Secondary | ICD-10-CM | POA: Diagnosis not present

## 2018-07-06 DIAGNOSIS — E559 Vitamin D deficiency, unspecified: Secondary | ICD-10-CM | POA: Diagnosis not present

## 2018-07-06 DIAGNOSIS — I509 Heart failure, unspecified: Secondary | ICD-10-CM | POA: Diagnosis not present

## 2018-08-18 DIAGNOSIS — G894 Chronic pain syndrome: Secondary | ICD-10-CM | POA: Diagnosis not present

## 2018-08-18 DIAGNOSIS — M170 Bilateral primary osteoarthritis of knee: Secondary | ICD-10-CM | POA: Diagnosis not present

## 2018-09-01 DIAGNOSIS — Z20828 Contact with and (suspected) exposure to other viral communicable diseases: Secondary | ICD-10-CM | POA: Diagnosis not present

## 2018-09-03 DIAGNOSIS — E1165 Type 2 diabetes mellitus with hyperglycemia: Secondary | ICD-10-CM | POA: Diagnosis not present

## 2018-09-03 DIAGNOSIS — E785 Hyperlipidemia, unspecified: Secondary | ICD-10-CM | POA: Diagnosis not present

## 2018-09-03 DIAGNOSIS — I10 Essential (primary) hypertension: Secondary | ICD-10-CM | POA: Diagnosis not present

## 2018-09-03 DIAGNOSIS — E559 Vitamin D deficiency, unspecified: Secondary | ICD-10-CM | POA: Diagnosis not present

## 2018-09-03 DIAGNOSIS — E119 Type 2 diabetes mellitus without complications: Secondary | ICD-10-CM | POA: Diagnosis not present

## 2018-09-03 DIAGNOSIS — I509 Heart failure, unspecified: Secondary | ICD-10-CM | POA: Diagnosis not present

## 2018-09-03 DIAGNOSIS — J4530 Mild persistent asthma, uncomplicated: Secondary | ICD-10-CM | POA: Diagnosis not present

## 2018-09-03 DIAGNOSIS — E039 Hypothyroidism, unspecified: Secondary | ICD-10-CM | POA: Diagnosis not present

## 2018-10-21 DIAGNOSIS — J4530 Mild persistent asthma, uncomplicated: Secondary | ICD-10-CM | POA: Diagnosis not present

## 2018-10-21 DIAGNOSIS — E119 Type 2 diabetes mellitus without complications: Secondary | ICD-10-CM | POA: Diagnosis not present

## 2018-10-21 DIAGNOSIS — I10 Essential (primary) hypertension: Secondary | ICD-10-CM | POA: Diagnosis not present

## 2018-10-21 DIAGNOSIS — E1165 Type 2 diabetes mellitus with hyperglycemia: Secondary | ICD-10-CM | POA: Diagnosis not present

## 2018-10-21 DIAGNOSIS — I509 Heart failure, unspecified: Secondary | ICD-10-CM | POA: Diagnosis not present

## 2018-10-21 DIAGNOSIS — E785 Hyperlipidemia, unspecified: Secondary | ICD-10-CM | POA: Diagnosis not present

## 2018-10-21 DIAGNOSIS — E039 Hypothyroidism, unspecified: Secondary | ICD-10-CM | POA: Diagnosis not present

## 2018-10-21 DIAGNOSIS — E559 Vitamin D deficiency, unspecified: Secondary | ICD-10-CM | POA: Diagnosis not present

## 2018-12-09 DIAGNOSIS — E559 Vitamin D deficiency, unspecified: Secondary | ICD-10-CM | POA: Diagnosis not present

## 2018-12-09 DIAGNOSIS — I739 Peripheral vascular disease, unspecified: Secondary | ICD-10-CM | POA: Diagnosis not present

## 2018-12-09 DIAGNOSIS — E785 Hyperlipidemia, unspecified: Secondary | ICD-10-CM | POA: Diagnosis not present

## 2018-12-09 DIAGNOSIS — E119 Type 2 diabetes mellitus without complications: Secondary | ICD-10-CM | POA: Diagnosis not present

## 2018-12-09 DIAGNOSIS — Z0000 Encounter for general adult medical examination without abnormal findings: Secondary | ICD-10-CM | POA: Diagnosis not present

## 2018-12-09 DIAGNOSIS — I878 Other specified disorders of veins: Secondary | ICD-10-CM | POA: Diagnosis not present

## 2018-12-09 DIAGNOSIS — I10 Essential (primary) hypertension: Secondary | ICD-10-CM | POA: Diagnosis not present

## 2018-12-09 DIAGNOSIS — I509 Heart failure, unspecified: Secondary | ICD-10-CM | POA: Diagnosis not present

## 2018-12-09 DIAGNOSIS — J4530 Mild persistent asthma, uncomplicated: Secondary | ICD-10-CM | POA: Diagnosis not present

## 2018-12-09 DIAGNOSIS — E039 Hypothyroidism, unspecified: Secondary | ICD-10-CM | POA: Diagnosis not present

## 2019-01-17 DIAGNOSIS — I10 Essential (primary) hypertension: Secondary | ICD-10-CM | POA: Diagnosis not present

## 2019-01-17 DIAGNOSIS — E559 Vitamin D deficiency, unspecified: Secondary | ICD-10-CM | POA: Diagnosis not present

## 2019-01-17 DIAGNOSIS — J4530 Mild persistent asthma, uncomplicated: Secondary | ICD-10-CM | POA: Diagnosis not present

## 2019-01-17 DIAGNOSIS — Z0001 Encounter for general adult medical examination with abnormal findings: Secondary | ICD-10-CM | POA: Diagnosis not present

## 2019-01-17 DIAGNOSIS — E039 Hypothyroidism, unspecified: Secondary | ICD-10-CM | POA: Diagnosis not present

## 2019-01-17 DIAGNOSIS — J302 Other seasonal allergic rhinitis: Secondary | ICD-10-CM | POA: Diagnosis not present

## 2019-01-17 DIAGNOSIS — Z2821 Immunization not carried out because of patient refusal: Secondary | ICD-10-CM | POA: Diagnosis not present

## 2019-01-17 DIAGNOSIS — I509 Heart failure, unspecified: Secondary | ICD-10-CM | POA: Diagnosis not present

## 2019-01-17 DIAGNOSIS — E119 Type 2 diabetes mellitus without complications: Secondary | ICD-10-CM | POA: Diagnosis not present

## 2019-02-14 DIAGNOSIS — E039 Hypothyroidism, unspecified: Secondary | ICD-10-CM | POA: Diagnosis not present

## 2019-02-14 DIAGNOSIS — J4530 Mild persistent asthma, uncomplicated: Secondary | ICD-10-CM | POA: Diagnosis not present

## 2019-02-14 DIAGNOSIS — E559 Vitamin D deficiency, unspecified: Secondary | ICD-10-CM | POA: Diagnosis not present

## 2019-02-14 DIAGNOSIS — I10 Essential (primary) hypertension: Secondary | ICD-10-CM | POA: Diagnosis not present

## 2019-02-14 DIAGNOSIS — I5032 Chronic diastolic (congestive) heart failure: Secondary | ICD-10-CM | POA: Diagnosis not present

## 2019-02-14 DIAGNOSIS — E782 Mixed hyperlipidemia: Secondary | ICD-10-CM | POA: Diagnosis not present

## 2019-02-14 DIAGNOSIS — E1165 Type 2 diabetes mellitus with hyperglycemia: Secondary | ICD-10-CM | POA: Diagnosis not present

## 2019-02-14 DIAGNOSIS — J302 Other seasonal allergic rhinitis: Secondary | ICD-10-CM | POA: Diagnosis not present

## 2019-02-14 DIAGNOSIS — R05 Cough: Secondary | ICD-10-CM | POA: Diagnosis not present

## 2019-02-17 DIAGNOSIS — I878 Other specified disorders of veins: Secondary | ICD-10-CM | POA: Diagnosis not present

## 2019-02-17 DIAGNOSIS — M7989 Other specified soft tissue disorders: Secondary | ICD-10-CM | POA: Diagnosis not present

## 2019-02-17 DIAGNOSIS — I739 Peripheral vascular disease, unspecified: Secondary | ICD-10-CM | POA: Diagnosis not present

## 2019-03-07 DIAGNOSIS — J4530 Mild persistent asthma, uncomplicated: Secondary | ICD-10-CM | POA: Diagnosis not present

## 2019-03-07 DIAGNOSIS — E1165 Type 2 diabetes mellitus with hyperglycemia: Secondary | ICD-10-CM | POA: Diagnosis not present

## 2019-03-07 DIAGNOSIS — E039 Hypothyroidism, unspecified: Secondary | ICD-10-CM | POA: Diagnosis not present

## 2019-03-07 DIAGNOSIS — E782 Mixed hyperlipidemia: Secondary | ICD-10-CM | POA: Diagnosis not present

## 2019-03-07 DIAGNOSIS — E559 Vitamin D deficiency, unspecified: Secondary | ICD-10-CM | POA: Diagnosis not present

## 2019-03-07 DIAGNOSIS — J302 Other seasonal allergic rhinitis: Secondary | ICD-10-CM | POA: Diagnosis not present

## 2019-03-07 DIAGNOSIS — I10 Essential (primary) hypertension: Secondary | ICD-10-CM | POA: Diagnosis not present

## 2019-03-07 DIAGNOSIS — Z23 Encounter for immunization: Secondary | ICD-10-CM | POA: Diagnosis not present

## 2019-03-07 DIAGNOSIS — I5032 Chronic diastolic (congestive) heart failure: Secondary | ICD-10-CM | POA: Diagnosis not present

## 2019-04-05 DIAGNOSIS — R05 Cough: Secondary | ICD-10-CM | POA: Diagnosis not present

## 2019-04-05 DIAGNOSIS — Z789 Other specified health status: Secondary | ICD-10-CM | POA: Diagnosis not present

## 2019-07-06 DIAGNOSIS — J4530 Mild persistent asthma, uncomplicated: Secondary | ICD-10-CM | POA: Diagnosis not present

## 2019-07-06 DIAGNOSIS — E559 Vitamin D deficiency, unspecified: Secondary | ICD-10-CM | POA: Diagnosis not present

## 2019-07-06 DIAGNOSIS — E1165 Type 2 diabetes mellitus with hyperglycemia: Secondary | ICD-10-CM | POA: Diagnosis not present

## 2019-07-06 DIAGNOSIS — I10 Essential (primary) hypertension: Secondary | ICD-10-CM | POA: Diagnosis not present

## 2019-07-06 DIAGNOSIS — E039 Hypothyroidism, unspecified: Secondary | ICD-10-CM | POA: Diagnosis not present

## 2019-07-06 DIAGNOSIS — I5032 Chronic diastolic (congestive) heart failure: Secondary | ICD-10-CM | POA: Diagnosis not present

## 2019-07-06 DIAGNOSIS — J302 Other seasonal allergic rhinitis: Secondary | ICD-10-CM | POA: Diagnosis not present

## 2019-07-06 DIAGNOSIS — E782 Mixed hyperlipidemia: Secondary | ICD-10-CM | POA: Diagnosis not present

## 2019-09-12 DIAGNOSIS — E782 Mixed hyperlipidemia: Secondary | ICD-10-CM | POA: Diagnosis not present

## 2019-09-12 DIAGNOSIS — J302 Other seasonal allergic rhinitis: Secondary | ICD-10-CM | POA: Diagnosis not present

## 2019-09-12 DIAGNOSIS — J4530 Mild persistent asthma, uncomplicated: Secondary | ICD-10-CM | POA: Diagnosis not present

## 2019-09-12 DIAGNOSIS — E1165 Type 2 diabetes mellitus with hyperglycemia: Secondary | ICD-10-CM | POA: Diagnosis not present

## 2019-09-12 DIAGNOSIS — J189 Pneumonia, unspecified organism: Secondary | ICD-10-CM | POA: Diagnosis not present

## 2019-09-12 DIAGNOSIS — Z0001 Encounter for general adult medical examination with abnormal findings: Secondary | ICD-10-CM | POA: Diagnosis not present

## 2019-09-12 DIAGNOSIS — I10 Essential (primary) hypertension: Secondary | ICD-10-CM | POA: Diagnosis not present

## 2019-09-12 DIAGNOSIS — I5032 Chronic diastolic (congestive) heart failure: Secondary | ICD-10-CM | POA: Diagnosis not present

## 2019-11-16 DIAGNOSIS — J302 Other seasonal allergic rhinitis: Secondary | ICD-10-CM | POA: Diagnosis not present

## 2019-11-16 DIAGNOSIS — E559 Vitamin D deficiency, unspecified: Secondary | ICD-10-CM | POA: Diagnosis not present

## 2019-11-16 DIAGNOSIS — I5032 Chronic diastolic (congestive) heart failure: Secondary | ICD-10-CM | POA: Diagnosis not present

## 2019-11-16 DIAGNOSIS — E1165 Type 2 diabetes mellitus with hyperglycemia: Secondary | ICD-10-CM | POA: Diagnosis not present

## 2019-11-16 DIAGNOSIS — I10 Essential (primary) hypertension: Secondary | ICD-10-CM | POA: Diagnosis not present

## 2019-11-16 DIAGNOSIS — J4530 Mild persistent asthma, uncomplicated: Secondary | ICD-10-CM | POA: Diagnosis not present

## 2019-11-16 DIAGNOSIS — E782 Mixed hyperlipidemia: Secondary | ICD-10-CM | POA: Diagnosis not present

## 2019-11-16 DIAGNOSIS — E039 Hypothyroidism, unspecified: Secondary | ICD-10-CM | POA: Diagnosis not present

## 2019-11-17 DIAGNOSIS — J449 Chronic obstructive pulmonary disease, unspecified: Secondary | ICD-10-CM | POA: Insufficient documentation

## 2019-11-17 DIAGNOSIS — I509 Heart failure, unspecified: Secondary | ICD-10-CM | POA: Insufficient documentation

## 2019-11-17 DIAGNOSIS — J4530 Mild persistent asthma, uncomplicated: Secondary | ICD-10-CM | POA: Insufficient documentation

## 2019-11-17 DIAGNOSIS — E559 Vitamin D deficiency, unspecified: Secondary | ICD-10-CM | POA: Insufficient documentation

## 2019-11-17 DIAGNOSIS — K30 Functional dyspepsia: Secondary | ICD-10-CM | POA: Insufficient documentation

## 2019-11-17 DIAGNOSIS — E782 Mixed hyperlipidemia: Secondary | ICD-10-CM | POA: Insufficient documentation

## 2019-11-22 DIAGNOSIS — E039 Hypothyroidism, unspecified: Secondary | ICD-10-CM | POA: Diagnosis not present

## 2019-11-22 DIAGNOSIS — E1165 Type 2 diabetes mellitus with hyperglycemia: Secondary | ICD-10-CM | POA: Diagnosis not present

## 2019-12-19 DIAGNOSIS — Z20828 Contact with and (suspected) exposure to other viral communicable diseases: Secondary | ICD-10-CM | POA: Diagnosis not present

## 2019-12-28 DIAGNOSIS — U071 COVID-19: Secondary | ICD-10-CM | POA: Diagnosis not present

## 2019-12-28 DIAGNOSIS — R05 Cough: Secondary | ICD-10-CM | POA: Diagnosis not present

## 2020-01-04 ENCOUNTER — Encounter (HOSPITAL_COMMUNITY): Payer: Medicare HMO

## 2020-01-18 ENCOUNTER — Other Ambulatory Visit: Payer: Self-pay | Admitting: *Deleted

## 2020-01-18 DIAGNOSIS — M25561 Pain in right knee: Secondary | ICD-10-CM

## 2020-01-18 DIAGNOSIS — E782 Mixed hyperlipidemia: Secondary | ICD-10-CM | POA: Diagnosis not present

## 2020-01-18 DIAGNOSIS — E1165 Type 2 diabetes mellitus with hyperglycemia: Secondary | ICD-10-CM | POA: Diagnosis not present

## 2020-01-18 DIAGNOSIS — J302 Other seasonal allergic rhinitis: Secondary | ICD-10-CM | POA: Diagnosis not present

## 2020-01-18 DIAGNOSIS — I10 Essential (primary) hypertension: Secondary | ICD-10-CM | POA: Diagnosis not present

## 2020-01-18 DIAGNOSIS — J4530 Mild persistent asthma, uncomplicated: Secondary | ICD-10-CM | POA: Diagnosis not present

## 2020-01-18 DIAGNOSIS — I5032 Chronic diastolic (congestive) heart failure: Secondary | ICD-10-CM | POA: Diagnosis not present

## 2020-01-18 DIAGNOSIS — Z0001 Encounter for general adult medical examination with abnormal findings: Secondary | ICD-10-CM | POA: Diagnosis not present

## 2020-01-18 DIAGNOSIS — E039 Hypothyroidism, unspecified: Secondary | ICD-10-CM | POA: Diagnosis not present

## 2020-02-06 ENCOUNTER — Encounter (HOSPITAL_COMMUNITY): Payer: Medicare HMO

## 2020-02-10 ENCOUNTER — Other Ambulatory Visit: Payer: Self-pay

## 2020-02-10 DIAGNOSIS — I890 Lymphedema, not elsewhere classified: Secondary | ICD-10-CM

## 2020-02-13 ENCOUNTER — Other Ambulatory Visit: Payer: Self-pay | Admitting: Physician Assistant

## 2020-02-13 DIAGNOSIS — M25562 Pain in left knee: Secondary | ICD-10-CM

## 2020-02-13 DIAGNOSIS — M25561 Pain in right knee: Secondary | ICD-10-CM

## 2020-02-21 DIAGNOSIS — I83893 Varicose veins of bilateral lower extremities with other complications: Secondary | ICD-10-CM | POA: Diagnosis not present

## 2020-02-21 DIAGNOSIS — I5022 Chronic systolic (congestive) heart failure: Secondary | ICD-10-CM | POA: Diagnosis not present

## 2020-02-21 DIAGNOSIS — I890 Lymphedema, not elsewhere classified: Secondary | ICD-10-CM | POA: Diagnosis not present

## 2020-02-27 ENCOUNTER — Encounter (HOSPITAL_COMMUNITY): Payer: Medicare HMO

## 2020-03-06 DIAGNOSIS — E039 Hypothyroidism, unspecified: Secondary | ICD-10-CM | POA: Diagnosis not present

## 2020-03-06 DIAGNOSIS — N3281 Overactive bladder: Secondary | ICD-10-CM | POA: Diagnosis not present

## 2020-03-06 DIAGNOSIS — J4530 Mild persistent asthma, uncomplicated: Secondary | ICD-10-CM | POA: Diagnosis not present

## 2020-03-06 DIAGNOSIS — I10 Essential (primary) hypertension: Secondary | ICD-10-CM | POA: Diagnosis not present

## 2020-03-06 DIAGNOSIS — E1165 Type 2 diabetes mellitus with hyperglycemia: Secondary | ICD-10-CM | POA: Diagnosis not present

## 2020-03-06 DIAGNOSIS — E559 Vitamin D deficiency, unspecified: Secondary | ICD-10-CM | POA: Diagnosis not present

## 2020-03-06 DIAGNOSIS — E782 Mixed hyperlipidemia: Secondary | ICD-10-CM | POA: Diagnosis not present

## 2020-03-06 DIAGNOSIS — I5032 Chronic diastolic (congestive) heart failure: Secondary | ICD-10-CM | POA: Diagnosis not present

## 2020-03-24 IMAGING — DX DG CHEST 2V
2 series · 2 of 2 positions shown · non-contrast
Comparison: None.

CLINICAL DATA: Pt c/o sob with cough x 1 week. Hx of htn and dm.

EXAM:
CHEST - 2 VIEW

[chest pa]
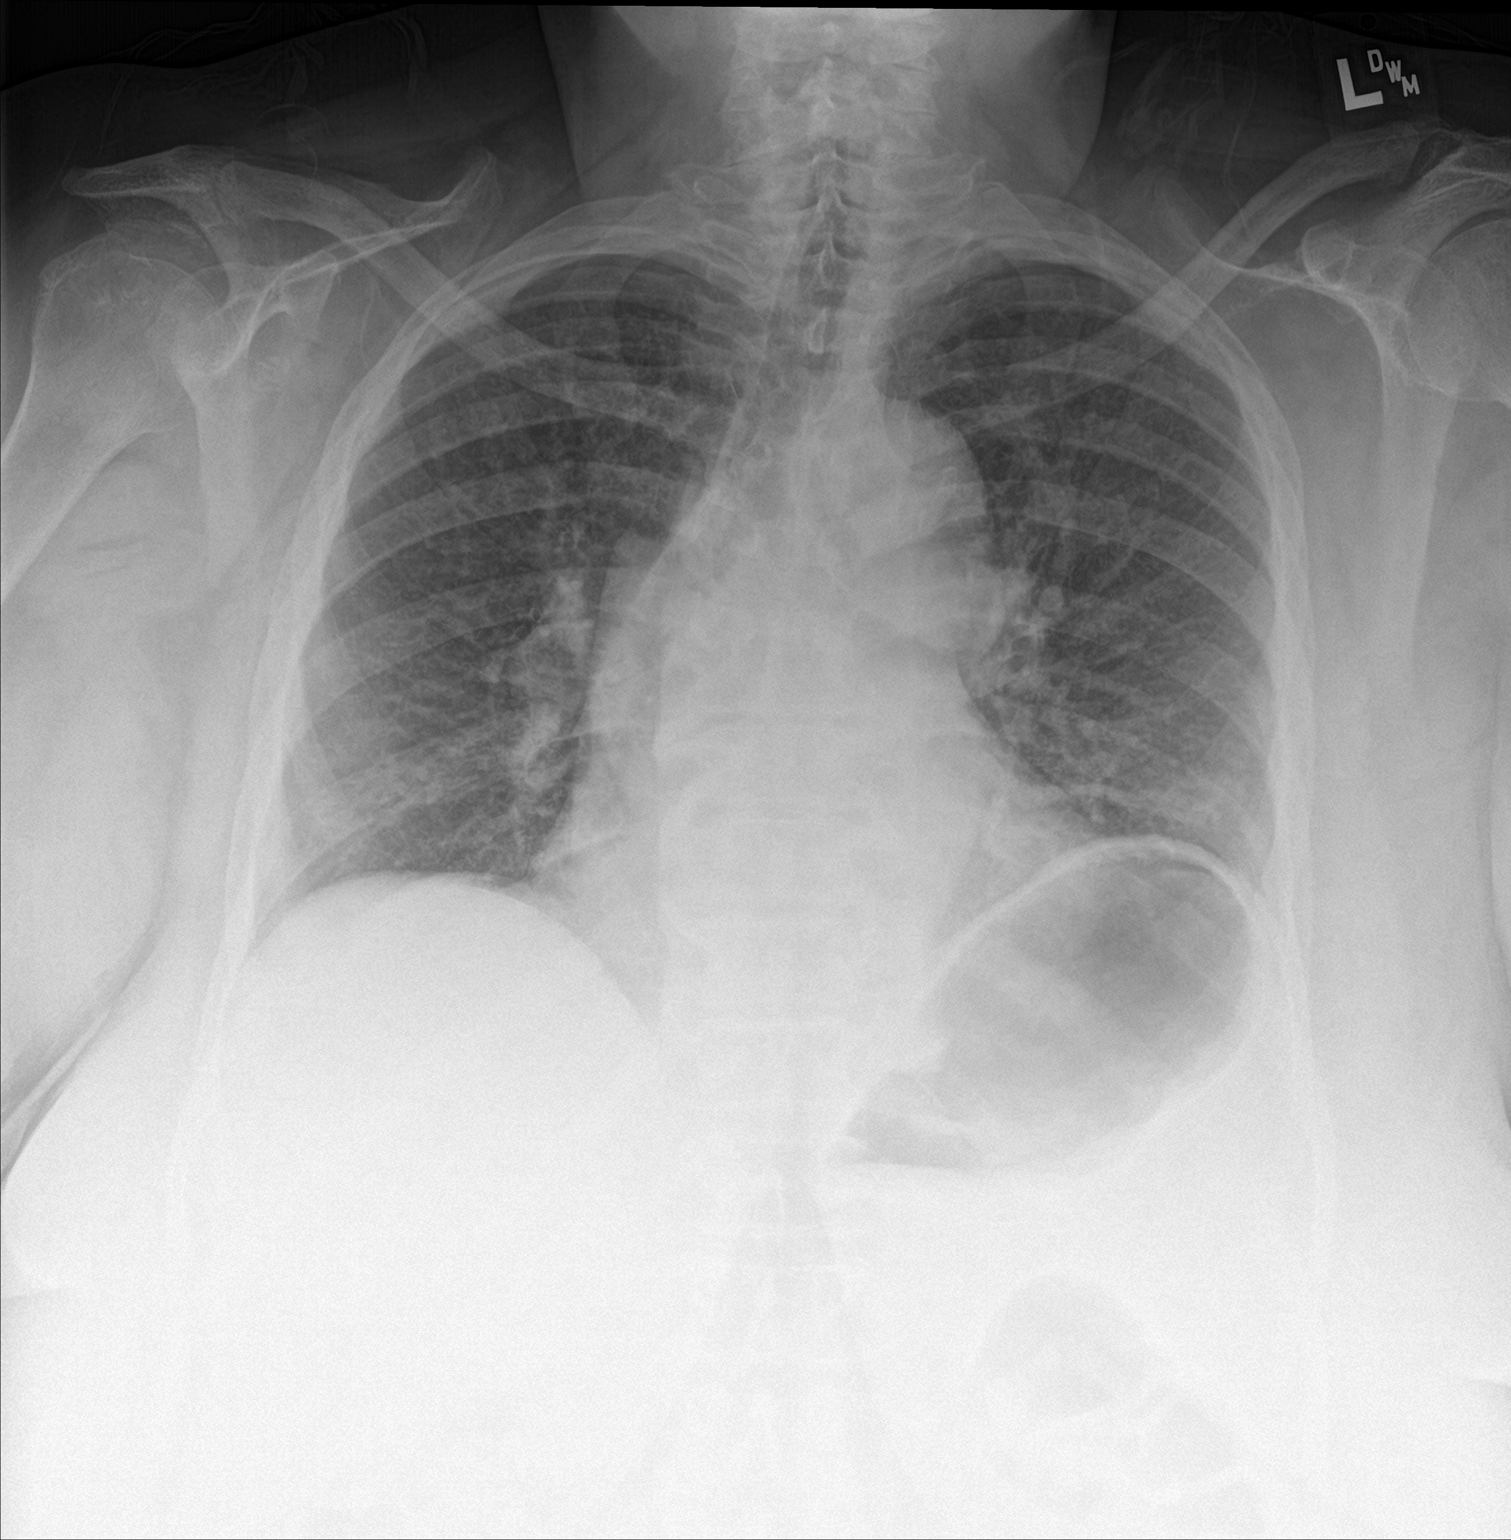

[chest lat]
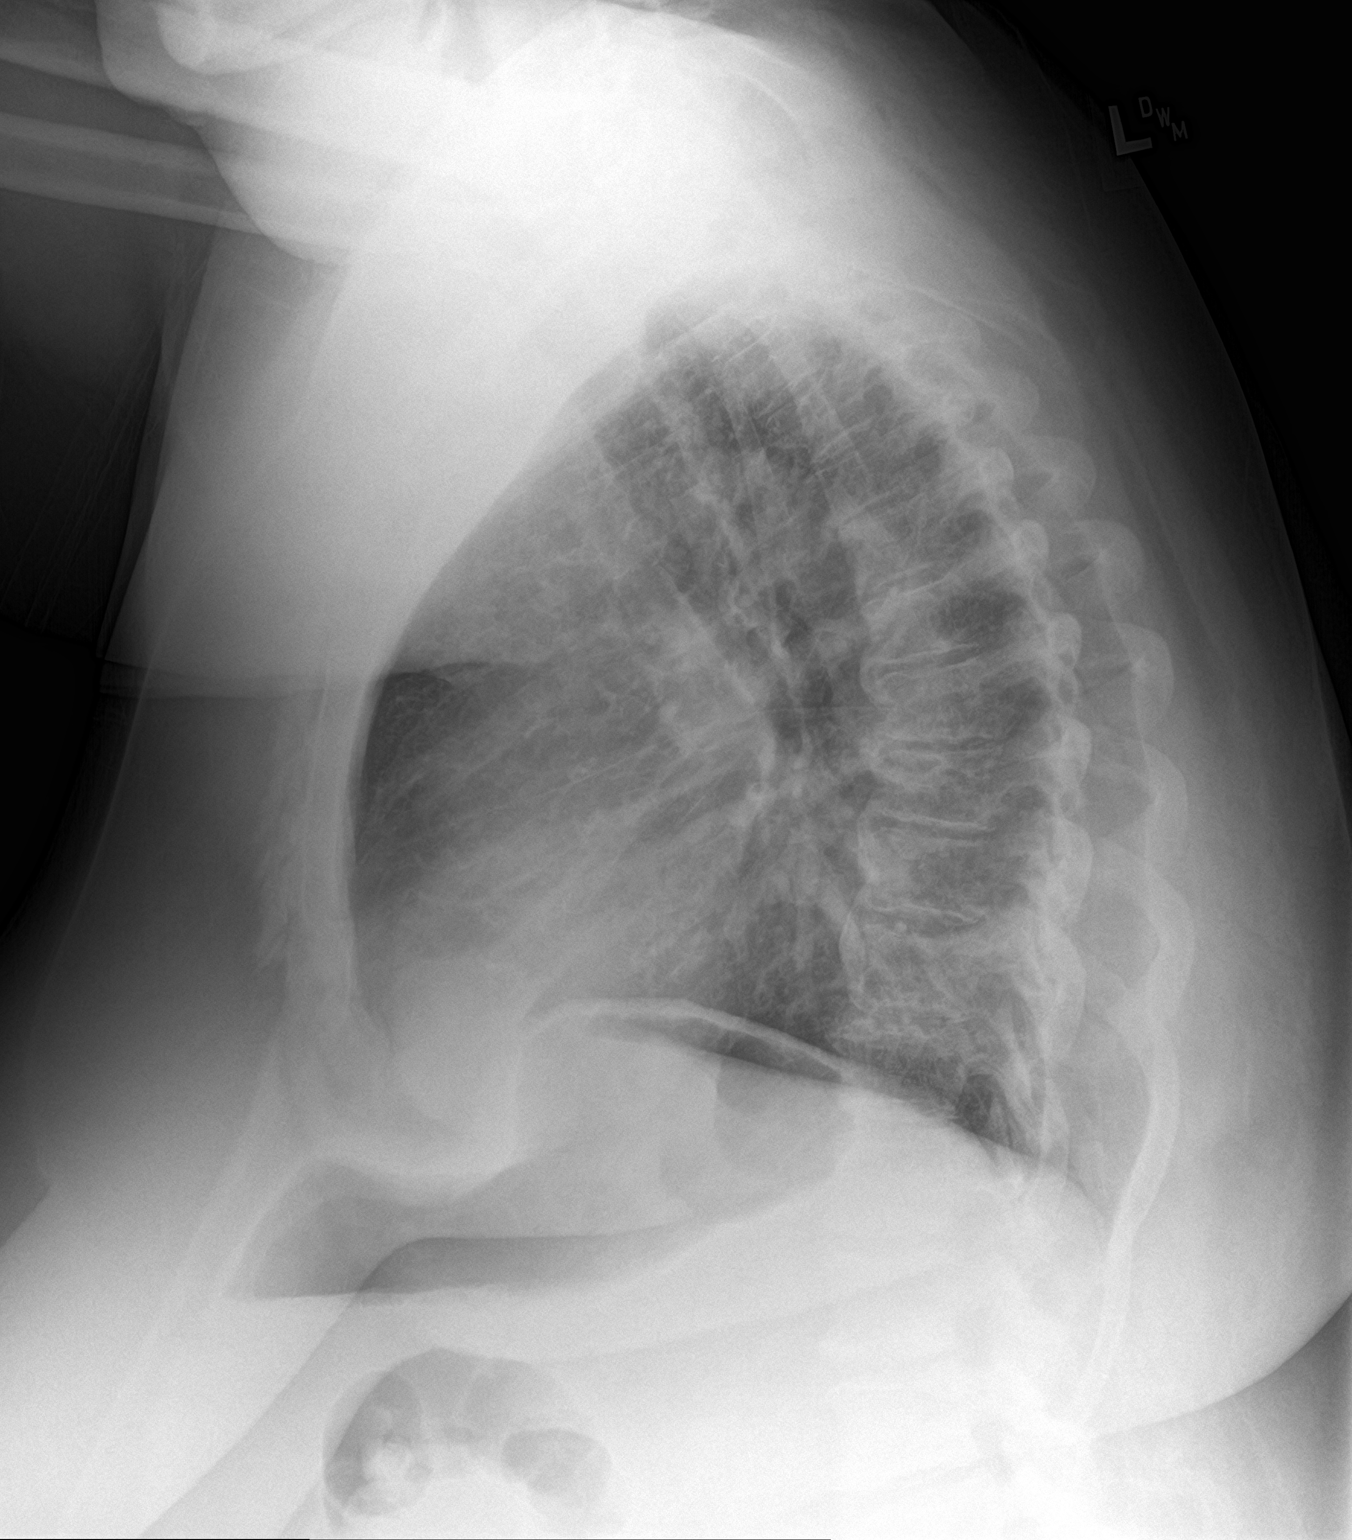

[2 of 2 positions shown; findings below may reference images not displayed]

FINDINGS: Moderate thoracic spondylosis. Midline trachea. Borderline
cardiomegaly. Mildly tortuous thoracic aorta. Mild left
hemidiaphragm elevation. No pleural effusion or pneumothorax.
Pulmonary interstitial prominence is favored to be due to low lung
volumes and patient body habitus. No well-defined lobar
consolidation.
IMPRESSION: Borderline cardiomegaly with interstitial prominence, favored to be
technique related. Mild pulmonary venous congestion could look
similar. No overt congestive failure or convincing evidence of acute
disease.
